# Patient Record
Sex: Female | Born: 1945 | Race: White | Hispanic: No | Marital: Married
Health system: Midwestern US, Academic
[De-identification: ages and names within clinical notes are randomized; demographics above are authoritative.]

---

## 2016-05-23 MED ORDER — FLUCONAZOLE 150 MG PO TAB
ORAL_TABLET | ORAL | 0 refills | 3.00000 days | Status: DC
Start: 2016-05-23 — End: 2016-06-09

## 2016-05-23 MED ORDER — AMOXICILLIN-POT CLAVULANATE 875-125 MG PO TAB
1 | ORAL_TABLET | Freq: Two times a day (BID) | ORAL | 0 refills | 7.00000 days | Status: DC
Start: 2016-05-23 — End: 2016-06-09

## 2016-06-09 MED ORDER — METHOCARBAMOL 500 MG PO TAB
500-1000 mg | ORAL_TABLET | Freq: Four times a day (QID) | ORAL | 5 refills | Status: DC | PRN
Start: 2016-06-09 — End: 2017-01-05

## 2016-06-09 MED ORDER — TRAMADOL 50 MG PO TAB
50-100 mg | ORAL_TABLET | ORAL | 3 refills | Status: DC | PRN
Start: 2016-06-09 — End: 2017-02-02

## 2016-07-26 ENCOUNTER — Encounter: Admit: 2016-07-26 | Discharge: 2016-07-26 | Payer: BC Managed Care – PPO

## 2016-08-23 ENCOUNTER — Ambulatory Visit: Admit: 2016-08-23 | Discharge: 2016-08-24 | Payer: MEDICARE

## 2016-08-23 ENCOUNTER — Encounter: Admit: 2016-08-23 | Discharge: 2016-08-23 | Payer: BC Managed Care – PPO

## 2016-08-23 DIAGNOSIS — R42 Dizziness and giddiness: ICD-10-CM

## 2016-08-23 DIAGNOSIS — I10 Essential (primary) hypertension: Principal | ICD-10-CM

## 2016-08-23 DIAGNOSIS — E039 Hypothyroidism, unspecified: Principal | ICD-10-CM

## 2016-08-23 DIAGNOSIS — K219 Gastro-esophageal reflux disease without esophagitis: ICD-10-CM

## 2016-08-23 DIAGNOSIS — J302 Other seasonal allergic rhinitis: ICD-10-CM

## 2016-08-23 DIAGNOSIS — G43909 Migraine, unspecified, not intractable, without status migrainosus: ICD-10-CM

## 2016-08-23 DIAGNOSIS — M1990 Unspecified osteoarthritis, unspecified site: ICD-10-CM

## 2016-08-23 DIAGNOSIS — K289 Gastrojejunal ulcer, unspecified as acute or chronic, without hemorrhage or perforation: ICD-10-CM

## 2016-08-23 DIAGNOSIS — E785 Hyperlipidemia, unspecified: ICD-10-CM

## 2016-08-23 DIAGNOSIS — N390 Urinary tract infection, site not specified: ICD-10-CM

## 2016-08-23 DIAGNOSIS — F329 Major depressive disorder, single episode, unspecified: ICD-10-CM

## 2016-08-23 DIAGNOSIS — M542 Cervicalgia: ICD-10-CM

## 2016-08-23 NOTE — Progress Notes
Date of Service: 08/23/2016    Tonya Williamson is a 70 y.o. female. DOB: [birth year]   MRN#: [id number]    Subjective:       71 yo female would like to have her bp checked- was 147/84 at her chiro's yesterday-  Checked it at home- 141/80  Having daily headaches for the past 3-4 weeks             Review of Systems   Constitution:        Lost 3 lbs since last visit   HENT:        Had an episode of room spinning about a week ago   Musculoskeletal: Positive for neck pain.        Going to pain clinic for her neck pain- Dr. Ramiro Harvest  Has tried acupuncture     Neurological:        Having some headaches - hurts in the back of her head- sometimes her whole head feels like it is throbbing  Had a neg MRI head 11-16         Objective:     ??? amitriptyline (ELAVIL) 25 mg tablet Take 1 tablet by mouth at bedtime daily.   ??? amLODIPine (NORVASC) 10 mg tablet Take 1 tablet by mouth daily.   ??? atorvastatin (LIPITOR) 40 mg tablet Take 1 tablet by mouth daily.   ??? CALCIUM CARBONATE/VITAMIN D3 (CALCIUM + D PO) Take 1 tablet by mouth daily.   ??? celecoxib (CELEBREX) 200 mg capsule Take 1 capsule by mouth daily.   ??? dicyclomine (BENTYL) 10 mg capsule TAKE ONE CAPSULE BY MOUTH TWICE DAILY   ??? DOCOSAHEXANOIC ACID/EPA (FISH OIL PO) Take 3 Caps by mouth twice daily.   ??? esomeprazole DR(+) (NEXIUM) 40 mg capsule TAKE 1 CAPSULE TWICE DAILY   ??? estradiol (ESTRACE) 0.5 mg tablet Take 1 tablet by mouth daily.   ??? levothyroxine (SYNTHROID) 125 mcg tablet Take 1 tablet by mouth daily.   ??? lisinopril (PRINIVIL, ZESTRIL) 40 mg tablet Take 1 tablet by mouth daily.   ??? methocarbamol (ROBAXIN) 500 mg tablet Take 1-2 tablets by mouth four times daily as needed for Spasms.   ??? mirabegron(+) ER [facility]) 25 mg tablet Take 1 tablet by mouth daily.   ??? MULTIVITAMIN PO Take  by mouth.   ??? traMADol (ULTRAM) 50 mg tablet Take 1-2 tablets by mouth every 6 hours as needed for Pain. Fax [phone]     Vitals:    08/23/16 1521   BP: 130/75   Pulse: 91 SpO2: 97%   Weight: 71.2 kg (157 lb)   Height: 165.1 cm (65)     Body mass index is 26.13 kg/m???.     Physical Exam   Constitutional: She is oriented to person, place, and time. She appears well-developed and well-nourished.   bp was 120/70 by me   HENT:   Head: Normocephalic and atraumatic.   Mouth/Throat: Oropharynx is clear and moist.   TMs clear   Eyes: Conjunctivae are normal. Pupils are equal, round, and reactive to light.   Neck: No thyromegaly present.   Limited ROM of neck   Cardiovascular: Normal rate and regular rhythm.    No murmur heard.  Pulmonary/Chest: Effort normal and breath sounds normal.   Abdominal: Soft. Bowel sounds are normal. She exhibits no distension and no mass. There is no tenderness.   Musculoskeletal: She exhibits no edema.   Lymphadenopathy:     She has no cervical adenopathy.  Neurological: She is alert and oriented to person, place, and time.   Skin: Skin is warm and dry.   Psychiatric: She has a normal mood and affect. Her behavior is normal. Judgment and thought content normal.   Nursing note and vitals reviewed.      A/p  htn- good control by clinic readings today  On lisinopril and norvasc  Hypertension Management:  Medication adherent: yes  Treatment goal: 130/80  Outside blood pressures being performed: yes  BP Readings from Last 3 Encounters:   08/23/16 130/75   06/09/16 100/70   11/23/15 120/82     She did not report significant light-headedness.  Imp: Hypertension controlled  Are barriers to achieving goals present? no  Patient able to self-manage and ready to comply? yes  Educational resources identified? Not at this time  Neck pain and headaches- pt will f/u with pain clinic

## 2016-10-25 ENCOUNTER — Encounter: Admit: 2016-10-25 | Discharge: 2016-10-25 | Payer: BC Managed Care – PPO

## 2016-10-25 MED ORDER — ESOMEPRAZOLE MAGNESIUM 40 MG PO CPDR
ORAL_CAPSULE | Freq: Two times a day (BID) | 3 refills | 30.00000 days | Status: AC
Start: 2016-10-25 — End: 2017-02-02

## 2016-10-25 NOTE — Telephone Encounter
Last refilled: 04/17/16 #90    Last OV: 08/23/16 HTN

## 2016-11-06 ENCOUNTER — Encounter: Admit: 2016-11-06 | Discharge: 2016-11-06 | Payer: BC Managed Care – PPO

## 2016-11-06 DIAGNOSIS — J329 Chronic sinusitis, unspecified: Principal | ICD-10-CM

## 2016-11-06 MED ORDER — AMOXICILLIN-POT CLAVULANATE 875-125 MG PO TAB
1 | ORAL_TABLET | Freq: Two times a day (BID) | ORAL | 0 refills | 7.00000 days | Status: AC
Start: 2016-11-06 — End: ?

## 2016-11-06 MED ORDER — FLUCONAZOLE 150 MG PO TAB
ORAL_TABLET | ORAL | 0 refills | 3.00000 days | Status: AC
Start: 2016-11-06 — End: 2017-01-29

## 2016-11-06 NOTE — Telephone Encounter
Patient notified - wants to thank you!

## 2016-11-06 NOTE — Telephone Encounter
States she would like to know if Dr. Reita Cliche could send a rx to her [facility] in [place] - believes she has a sinus infection - cough productive with green secretions - will also need a med for a yeast infection with an antibiotic.

## 2016-11-06 NOTE — Telephone Encounter
Ok on augmentin 875 mg #20 and diflucan 150 mg #2- rxs sent

## 2016-11-13 ENCOUNTER — Encounter: Admit: 2016-11-13 | Discharge: 2016-11-13 | Payer: BC Managed Care – PPO

## 2016-11-13 MED ORDER — AMITRIPTYLINE 25 MG PO TAB
ORAL_TABLET | Freq: Every evening | 3 refills
Start: 2016-11-13 — End: ?

## 2016-12-11 ENCOUNTER — Encounter: Admit: 2016-12-11 | Discharge: 2016-12-11 | Payer: BC Managed Care – PPO

## 2016-12-11 MED ORDER — CELECOXIB 200 MG PO CAP
ORAL_CAPSULE | Freq: Every day | 2 refills | Status: AC
Start: 2016-12-11 — End: 2017-02-02

## 2016-12-11 NOTE — Telephone Encounter
Last refilled: 01/12/16 #90 X 3    Last OV: 08/23/16 HTN

## 2017-01-05 ENCOUNTER — Encounter: Admit: 2017-01-05 | Discharge: 2017-01-05 | Payer: MEDICARE

## 2017-01-05 MED ORDER — METHOCARBAMOL 500 MG PO TAB
ORAL_TABLET | Freq: Four times a day (QID) | 5 refills | Status: AC | PRN
Start: 2017-01-05 — End: 2017-02-02

## 2017-01-10 ENCOUNTER — Encounter: Admit: 2017-01-10 | Discharge: 2017-01-10 | Payer: MEDICARE

## 2017-01-10 MED ORDER — DICYCLOMINE 10 MG PO CAP
ORAL_CAPSULE | Freq: Two times a day (BID) | 1 refills | Status: AC
Start: 2017-01-10 — End: 2017-02-02

## 2017-01-10 MED ORDER — LEVOTHYROXINE 125 MCG PO TAB
125 ug | ORAL_TABLET | Freq: Every day | ORAL | 1 refills | 30.00000 days | Status: AC
Start: 2017-01-10 — End: 2017-02-02

## 2017-01-10 MED ORDER — LISINOPRIL 40 MG PO TAB
40 mg | ORAL_TABLET | Freq: Every day | ORAL | 1 refills | Status: AC
Start: 2017-01-10 — End: 2017-02-02

## 2017-01-10 MED ORDER — ATORVASTATIN 40 MG PO TAB
40 mg | ORAL_TABLET | Freq: Every day | ORAL | 1 refills | Status: AC
Start: 2017-01-10 — End: 2017-02-02

## 2017-01-10 MED ORDER — ESTRADIOL 0.5 MG PO TAB
0.5 mg | ORAL_TABLET | Freq: Every day | ORAL | 1 refills | 30.00000 days | Status: AC
Start: 2017-01-10 — End: 2017-02-02

## 2017-01-10 NOTE — Telephone Encounter
Last refilled:    Lipitor- 11/23/15 #90 X 3   Bentyl- 04/17/16 #180 X 2   Estrace- 11/23/15 #90 X 3   Synthroid- 11/23/15 #9 0X 3   Lisinopril- 11/23/15    Last OV: 08/23/16 HTN

## 2017-01-29 ENCOUNTER — Encounter: Admit: 2017-01-29 | Discharge: 2017-01-29 | Payer: BC Managed Care – PPO

## 2017-01-29 DIAGNOSIS — B379 Candidiasis, unspecified: ICD-10-CM

## 2017-01-29 DIAGNOSIS — J329 Chronic sinusitis, unspecified: Principal | ICD-10-CM

## 2017-01-29 MED ORDER — AMOXICILLIN-POT CLAVULANATE 875-125 MG PO TAB
1 | ORAL_TABLET | Freq: Two times a day (BID) | ORAL | 0 refills | 7.00000 days | Status: AC
Start: 2017-01-29 — End: ?

## 2017-01-29 MED ORDER — FLUCONAZOLE 150 MG PO TAB
ORAL_TABLET | ORAL | 0 refills | 3.00000 days | Status: AC
Start: 2017-01-29 — End: 2017-04-03

## 2017-01-29 NOTE — Telephone Encounter
States she had a cold last week - has now turned into a sinus infection - cough - drainage down the back of her throat - secretions are green - also a bad sinus headache - would like to know if Dr. Reita ClicheWeinhold can send a rx for an antibiotic and also need diflucan for vaginal yeast from antibiotic.

## 2017-01-29 NOTE — Telephone Encounter
Ok on augmentin 875 mg po bid #20 and diflucan 150 mg #2

## 2017-01-29 NOTE — Telephone Encounter
Rx's sent - patient notified.

## 2017-02-02 ENCOUNTER — Ambulatory Visit: Admit: 2017-02-02 | Discharge: 2017-02-03 | Payer: MEDICARE

## 2017-02-02 ENCOUNTER — Encounter: Admit: 2017-02-02 | Discharge: 2017-02-02 | Payer: BC Managed Care – PPO

## 2017-02-02 DIAGNOSIS — E785 Hyperlipidemia, unspecified: ICD-10-CM

## 2017-02-02 DIAGNOSIS — J302 Other seasonal allergic rhinitis: ICD-10-CM

## 2017-02-02 DIAGNOSIS — M25512 Pain in left shoulder: ICD-10-CM

## 2017-02-02 DIAGNOSIS — R42 Dizziness and giddiness: ICD-10-CM

## 2017-02-02 DIAGNOSIS — I10 Essential (primary) hypertension: ICD-10-CM

## 2017-02-02 DIAGNOSIS — F329 Major depressive disorder, single episode, unspecified: ICD-10-CM

## 2017-02-02 DIAGNOSIS — K219 Gastro-esophageal reflux disease without esophagitis: ICD-10-CM

## 2017-02-02 DIAGNOSIS — N390 Urinary tract infection, site not specified: ICD-10-CM

## 2017-02-02 DIAGNOSIS — M8580 Other specified disorders of bone density and structure, unspecified site: ICD-10-CM

## 2017-02-02 DIAGNOSIS — G43909 Migraine, unspecified, not intractable, without status migrainosus: ICD-10-CM

## 2017-02-02 DIAGNOSIS — M1990 Unspecified osteoarthritis, unspecified site: ICD-10-CM

## 2017-02-02 DIAGNOSIS — N3941 Urge incontinence: Principal | ICD-10-CM

## 2017-02-02 DIAGNOSIS — Z780 Asymptomatic menopausal state: ICD-10-CM

## 2017-02-02 DIAGNOSIS — E039 Hypothyroidism, unspecified: ICD-10-CM

## 2017-02-02 DIAGNOSIS — Z1159 Encounter for screening for other viral diseases: ICD-10-CM

## 2017-02-02 DIAGNOSIS — K289 Gastrojejunal ulcer, unspecified as acute or chronic, without hemorrhage or perforation: ICD-10-CM

## 2017-02-02 MED ORDER — AMITRIPTYLINE 25 MG PO TAB
25 mg | ORAL_TABLET | Freq: Every evening | ORAL | 3 refills | Status: AC
Start: 2017-02-02 — End: 2017-12-17

## 2017-02-02 MED ORDER — MIRABEGRON 25 MG PO TB24
25 mg | ORAL_TABLET | Freq: Every day | ORAL | 3 refills | Status: AC
Start: 2017-02-02 — End: 2017-12-17

## 2017-02-02 MED ORDER — ATORVASTATIN 40 MG PO TAB
40 mg | ORAL_TABLET | Freq: Every day | ORAL | 3 refills | Status: AC
Start: 2017-02-02 — End: 2017-12-17

## 2017-02-02 MED ORDER — LISINOPRIL 40 MG PO TAB
40 mg | ORAL_TABLET | Freq: Every day | ORAL | 3 refills | Status: AC
Start: 2017-02-02 — End: 2017-12-17

## 2017-02-02 MED ORDER — CELECOXIB 200 MG PO CAP
200 mg | ORAL_CAPSULE | Freq: Every day | ORAL | 3 refills | Status: AC
Start: 2017-02-02 — End: 2017-12-17

## 2017-02-02 MED ORDER — METHOCARBAMOL 500 MG PO TAB
500-1000 mg | ORAL_TABLET | Freq: Four times a day (QID) | ORAL | 11 refills | Status: AC | PRN
Start: 2017-02-02 — End: 2017-12-17

## 2017-02-02 MED ORDER — AMLODIPINE 10 MG PO TAB
10 mg | ORAL_TABLET | Freq: Every day | ORAL | 3 refills | Status: AC
Start: 2017-02-02 — End: 2017-12-17

## 2017-02-02 MED ORDER — ESTRADIOL 0.5 MG PO TAB
0.5 mg | ORAL_TABLET | Freq: Every day | ORAL | 3 refills | 43.00000 days | Status: AC
Start: 2017-02-02 — End: 2017-12-17

## 2017-02-02 MED ORDER — DICYCLOMINE 10 MG PO CAP
10 mg | ORAL_CAPSULE | Freq: Two times a day (BID) | ORAL | 3 refills | Status: AC
Start: 2017-02-02 — End: 2017-12-17

## 2017-02-02 MED ORDER — LEVOTHYROXINE 125 MCG PO TAB
125 ug | ORAL_TABLET | Freq: Every day | ORAL | 3 refills | 30.00000 days | Status: AC
Start: 2017-02-02 — End: 2017-05-07

## 2017-02-02 MED ORDER — ESOMEPRAZOLE MAGNESIUM 40 MG PO CPDR
40 mg | ORAL_CAPSULE | Freq: Two times a day (BID) | ORAL | 3 refills | 30.00000 days | Status: AC
Start: 2017-02-02 — End: 2017-12-17

## 2017-02-02 NOTE — Progress Notes
Date of Service: 02/02/2017    Tonya Williamson is a 71 y.o. female. DOB: [birth year]   MRN#: [id number]    Subjective:       71 yo female here for f/u of medical issues hx of chronic neck pain- going to have her left shoulder replaced in April with Dr. Lisa Roca- hyperlipidemia-GERD-hypothyroidism  Reviewed hx in epic briefly  Needs med rfs             Review of Systems   Constitution: Negative for chills, diaphoresis and fever.        Wt stable   HENT: Negative for hearing loss.         On augmentin for a sinus infection- is feeling better  Goes to the dentist every 6 months   Eyes:        No recent vision changes- going to the eye doc in January   Cardiovascular: Negative for chest pain, leg swelling and palpitations.   Respiratory: Negative for cough and shortness of breath.    Hematologic/Lymphatic: Does not bruise/bleed easily.   Skin:        No current skin problems   Musculoskeletal:        Has neck and lt shoulder pain   Gastrointestinal: Positive for heartburn. Negative for abdominal pain, constipation, diarrhea, nausea and vomiting.        Her GERD sxs are under control   Genitourinary: Negative for dysuria and hematuria.   Neurological:        Her headaches are better   Psychiatric/Behavioral:        Sleeping ok  Stress level is good   Allergic/Immunologic: Negative.          Objective:     ??? amitriptyline (ELAVIL) 25 mg tablet Take 1 tablet by mouth at bedtime daily.   ??? amLODIPine (NORVASC) 10 mg tablet Take 1 tablet by mouth daily.   ??? amoxicillin/K clavulanate (AUGMENTIN) 875/125 mg tablet Take one tablet by mouth twice daily for 10 days. Take with food.   ??? atorvastatin (LIPITOR) 40 mg tablet TAKE 1 TABLET BY MOUTH DAILY.   ??? CALCIUM CARBONATE/VITAMIN D3 (CALCIUM + D PO) Take 1 tablet by mouth daily.   ??? celecoxib (CELEBREX) 200 mg capsule TAKE 1 CAPSULE DAILY   ??? dicyclomine (BENTYL) 10 mg capsule TAKE 1 CAPSULE BY MOUTH TWICE DAILY. ??? DOCOSAHEXANOIC ACID/EPA (FISH OIL PO) Take 3 Caps by mouth twice daily.   ??? duloxetine DR (CYMBALTA) 60 mg capsule Take 60 mg by mouth daily.   ??? esomeprazole DR(+) (NEXIUM) 40 mg capsule TAKE 1 CAPSULE TWICE DAILY   ??? estradiol (ESTRACE) 0.5 mg tablet TAKE 1 TABLET BY MOUTH DAILY.   ??? fluconazole (DIFLUCAN) 150 mg tablet 1 tab now and repeat in 48 hours   ??? gabapentin (NEURONTIN) 100 mg capsule Take 100 mg by mouth three times daily.   ??? levothyroxine (SYNTHROID) 125 mcg tablet TAKE 1 TABLET BY MOUTH DAILY.   ??? lisinopril (PRINIVIL, ZESTRIL) 40 mg tablet TAKE 1 TABLET BY MOUTH DAILY.   ??? methocarbamol (ROBAXIN) 500 mg tablet TAKE 1 TO 2 TABLETS BY MOUTH FOUR TIMES DAILY AS NEEDED FOR SPASMS.   ??? mirabegron(+) ER [facility]) 25 mg tablet Take 1 tablet by mouth daily.   ??? MULTIVITAMIN PO Take  by mouth.   ??? traMADol (ULTRAM) 50 mg tablet Take 1-2 tablets by mouth every 6 hours as needed for Pain. Fax [phone]     Vitals:    02/02/17  0848   BP: 135/85   Pulse: 71   SpO2: 98%   Weight: 73 kg (161 lb)   Height: 165.1 cm (65)     Body mass index is 26.79 kg/m???.     Physical Exam   Constitutional: She is oriented to person, place, and time. She appears well-developed and well-nourished.   HENT:   Head: Normocephalic and atraumatic.   Mouth/Throat: Oropharynx is clear and moist.   TMs clear   Eyes: Conjunctivae are normal. Pupils are equal, round, and reactive to light.   Neck: Neck supple. No thyromegaly present.   Cardiovascular: Normal rate and regular rhythm.   No murmur heard.  Pulmonary/Chest: Effort normal and breath sounds normal.   Abdominal: Soft. Bowel sounds are normal. She exhibits no distension and no mass. There is no tenderness.   Genitourinary:   Genitourinary Comments: Breast exam- no dominant masses, dimpling, d/c, or adenopathy   Musculoskeletal: She exhibits no edema.   Lymphadenopathy:     She has no cervical adenopathy.   Neurological: She is alert and oriented to person, place, and time. Skin: Skin is warm and dry.   Psychiatric: She has a normal mood and affect. Her behavior is normal. Judgment and thought content normal.   Nursing note and vitals reviewed.      A/p  htn- bp was 130/65 on recheck- under good control  Hypertension Management:  Medication adherent: yes    Treatment goal: 130/80 or less  Outside blood pressures being performed: yes  BP Readings from Last 3 Encounters:   02/02/17 130/65   08/23/16 130/75   06/09/16 100/70     She did not report significant light-headedness.  Imp: Hypertension controlled  Are barriers to achieving goals present? no  Patient able to self-manage and ready to comply? yes  Educational resources identified? Not at this time  Urge incontinence- cont myrbetriq  Postmenopausal- osteopenia- check dexa  Hyperlipidemia- check lab this spring  Hypothyroidism- check lab this spring  Hept c screening test  Lt shoulder pain- going to to have it replaced this spring  Will come back for a preop visit   Up to date on her mammogram- colonoscopy- immunizations except for her 2nd shingles shot

## 2017-03-06 ENCOUNTER — Encounter: Admit: 2017-03-06 | Discharge: 2017-03-06 | Payer: MEDICARE

## 2017-03-06 ENCOUNTER — Encounter: Admit: 2017-03-06 | Discharge: 2017-03-06 | Payer: BC Managed Care – PPO

## 2017-03-06 DIAGNOSIS — B379 Candidiasis, unspecified: Principal | ICD-10-CM

## 2017-03-06 MED ORDER — AMOXICILLIN-POT CLAVULANATE 875-125 MG PO TAB
1 | ORAL_TABLET | Freq: Two times a day (BID) | ORAL | 0 refills | 7.00000 days | Status: AC
Start: 2017-03-06 — End: 2017-04-03

## 2017-03-06 MED ORDER — FLUCONAZOLE 150 MG PO TAB
ORAL_TABLET | ORAL | 0 refills | 3.00000 days | Status: AC
Start: 2017-03-06 — End: 2017-04-03

## 2017-03-13 ENCOUNTER — Ambulatory Visit: Admit: 2017-03-13 | Discharge: 2017-03-14 | Payer: MEDICARE

## 2017-03-13 ENCOUNTER — Encounter: Admit: 2017-03-13 | Discharge: 2017-03-13 | Payer: BC Managed Care – PPO

## 2017-03-13 DIAGNOSIS — K289 Gastrojejunal ulcer, unspecified as acute or chronic, without hemorrhage or perforation: ICD-10-CM

## 2017-03-13 DIAGNOSIS — F329 Major depressive disorder, single episode, unspecified: ICD-10-CM

## 2017-03-13 DIAGNOSIS — G43909 Migraine, unspecified, not intractable, without status migrainosus: ICD-10-CM

## 2017-03-13 DIAGNOSIS — E039 Hypothyroidism, unspecified: Principal | ICD-10-CM

## 2017-03-13 DIAGNOSIS — M1990 Unspecified osteoarthritis, unspecified site: ICD-10-CM

## 2017-03-13 DIAGNOSIS — R42 Dizziness and giddiness: ICD-10-CM

## 2017-03-13 DIAGNOSIS — N390 Urinary tract infection, site not specified: ICD-10-CM

## 2017-03-13 DIAGNOSIS — J302 Other seasonal allergic rhinitis: ICD-10-CM

## 2017-03-13 DIAGNOSIS — K219 Gastro-esophageal reflux disease without esophagitis: ICD-10-CM

## 2017-03-13 DIAGNOSIS — E785 Hyperlipidemia, unspecified: ICD-10-CM

## 2017-03-13 DIAGNOSIS — R197 Diarrhea, unspecified: Principal | ICD-10-CM

## 2017-04-03 ENCOUNTER — Encounter: Admit: 2017-04-03 | Discharge: 2017-04-03 | Payer: BC Managed Care – PPO

## 2017-04-03 DIAGNOSIS — B379 Candidiasis, unspecified: Principal | ICD-10-CM

## 2017-04-03 MED ORDER — DOXYCYCLINE HYCLATE 100 MG PO TAB
100 mg | ORAL_TABLET | Freq: Two times a day (BID) | ORAL | 0 refills | 8.00000 days | Status: AC
Start: 2017-04-03 — End: ?

## 2017-04-03 MED ORDER — FLUCONAZOLE 150 MG PO TAB
ORAL_TABLET | ORAL | 0 refills | 3.00000 days | Status: AC
Start: 2017-04-03 — End: 2017-04-27

## 2017-04-27 ENCOUNTER — Encounter: Admit: 2017-04-27 | Discharge: 2017-04-27 | Payer: BC Managed Care – PPO

## 2017-04-27 DIAGNOSIS — B379 Candidiasis, unspecified: Principal | ICD-10-CM

## 2017-04-27 MED ORDER — FLUCONAZOLE 150 MG PO TAB
ORAL_TABLET | ORAL | 0 refills | 3.00000 days | Status: AC
Start: 2017-04-27 — End: 2017-05-21

## 2017-04-27 MED ORDER — AMOXICILLIN-POT CLAVULANATE 875-125 MG PO TAB
1 | ORAL_TABLET | Freq: Two times a day (BID) | ORAL | 0 refills | 7.00000 days | Status: AC
Start: 2017-04-27 — End: 2017-05-21

## 2017-05-04 ENCOUNTER — Encounter: Admit: 2017-05-04 | Discharge: 2017-05-04 | Payer: BC Managed Care – PPO

## 2017-05-04 ENCOUNTER — Ambulatory Visit: Admit: 2017-05-04 | Discharge: 2017-05-05 | Payer: MEDICARE

## 2017-05-04 ENCOUNTER — Ambulatory Visit: Admit: 2017-05-04 | Discharge: 2017-05-04 | Payer: MEDICARE

## 2017-05-04 ENCOUNTER — Ambulatory Visit: Admit: 2017-05-04 | Discharge: 2017-05-05 | Payer: BC Managed Care – PPO

## 2017-05-04 DIAGNOSIS — R7309 Other abnormal glucose: ICD-10-CM

## 2017-05-04 DIAGNOSIS — J302 Other seasonal allergic rhinitis: ICD-10-CM

## 2017-05-04 DIAGNOSIS — E785 Hyperlipidemia, unspecified: ICD-10-CM

## 2017-05-04 DIAGNOSIS — Z01818 Encounter for other preprocedural examination: ICD-10-CM

## 2017-05-04 DIAGNOSIS — R42 Dizziness and giddiness: ICD-10-CM

## 2017-05-04 DIAGNOSIS — K219 Gastro-esophageal reflux disease without esophagitis: ICD-10-CM

## 2017-05-04 DIAGNOSIS — E039 Hypothyroidism, unspecified: ICD-10-CM

## 2017-05-04 DIAGNOSIS — R32 Unspecified urinary incontinence: ICD-10-CM

## 2017-05-04 DIAGNOSIS — T148XXA Other injury of unspecified body region, initial encounter: ICD-10-CM

## 2017-05-04 DIAGNOSIS — I10 Essential (primary) hypertension: ICD-10-CM

## 2017-05-04 DIAGNOSIS — Z1159 Encounter for screening for other viral diseases: ICD-10-CM

## 2017-05-04 DIAGNOSIS — G43909 Migraine, unspecified, not intractable, without status migrainosus: ICD-10-CM

## 2017-05-04 DIAGNOSIS — N390 Urinary tract infection, site not specified: ICD-10-CM

## 2017-05-04 DIAGNOSIS — M25512 Pain in left shoulder: Principal | ICD-10-CM

## 2017-05-04 DIAGNOSIS — M1990 Unspecified osteoarthritis, unspecified site: ICD-10-CM

## 2017-05-04 DIAGNOSIS — F329 Major depressive disorder, single episode, unspecified: ICD-10-CM

## 2017-05-04 DIAGNOSIS — K289 Gastrojejunal ulcer, unspecified as acute or chronic, without hemorrhage or perforation: ICD-10-CM

## 2017-05-05 LAB — HEMOGLOBIN A1C: Lab: 6.2 % — ABNORMAL HIGH (ref 4.0–6.0)

## 2017-05-05 LAB — CBC AND DIFF
Lab: 0 % (ref 0–2)
Lab: 0 10*3/uL (ref 0–0.20)
Lab: 0.2 10*3/uL (ref 0–0.45)
Lab: 0.5 10*3/uL (ref 0–0.80)
Lab: 14 % (ref 11–15)
Lab: 2 % (ref 0–5)
Lab: 2.3 10*3/uL (ref 1.0–4.8)
Lab: 28 % (ref 24–44)
Lab: 30 pg (ref 26–34)
Lab: 33 g/dL (ref 32.0–36.0)
Lab: 373 10*3/uL (ref 150–400)
Lab: 38 % (ref 36–45)
Lab: 4.1 M/UL (ref 4.0–5.0)
Lab: 5.2 10*3/uL (ref 1.8–7.0)
Lab: 6 % (ref 4–12)
Lab: 64 % (ref >60)
Lab: 7.9 FL (ref >60)
Lab: 8.2 10*3/uL (ref 4.5–11.0)
Lab: 93 FL — ABNORMAL HIGH (ref 80–100)

## 2017-05-05 LAB — URINALYSIS DIPSTICK
Lab: NEGATIVE
Lab: NEGATIVE
Lab: NEGATIVE
Lab: NEGATIVE
Lab: NEGATIVE

## 2017-05-05 LAB — HEPATITIS C ANTIBODY W REFLEX HCV PCR QUANT: Lab: NEGATIVE % — ABNORMAL LOW (ref 28–42)

## 2017-05-05 LAB — COMPREHENSIVE METABOLIC PANEL
Lab: 0.7 mg/dL (ref 0.4–1.00)
Lab: 135 MMOL/L — ABNORMAL LOW (ref >40)
Lab: 3.5 MMOL/L (ref <100)
Lab: 88 mg/dL (ref 70–100)

## 2017-05-05 LAB — FREE T4-FREE THYROXINE: Lab: 1.2 ng/dL (ref 0.6–1.6)

## 2017-05-05 LAB — URINALYSIS, MICROSCOPIC

## 2017-05-05 LAB — LIPID PROFILE
Lab: 162 mg/dL (ref <200)
Lab: 226 mg/dL — ABNORMAL HIGH (ref <150)

## 2017-05-05 LAB — PTT (APTT): Lab: 28 s (ref 24.0–36.5)

## 2017-05-05 LAB — SED RATE: Lab: 15 mm/h (ref 0–30)

## 2017-05-05 LAB — PROTIME INR (PT): Lab: 1 MMOL/L (ref >60)

## 2017-05-05 LAB — TSH WITH FREE T4 REFLEX: Lab: 8.6 uU/mL — ABNORMAL HIGH (ref 0.35–5.00)

## 2017-05-06 LAB — CULTURE-URINE W/SENSITIVITY

## 2017-05-07 ENCOUNTER — Encounter: Admit: 2017-05-07 | Discharge: 2017-05-07 | Payer: BC Managed Care – PPO

## 2017-05-07 DIAGNOSIS — E039 Hypothyroidism, unspecified: Principal | ICD-10-CM

## 2017-05-07 MED ORDER — LEVOTHYROXINE 150 MCG PO TAB
150 ug | ORAL_TABLET | Freq: Every day | ORAL | 3 refills | 30.00000 days | Status: AC
Start: 2017-05-07 — End: 2018-04-06

## 2017-05-17 ENCOUNTER — Encounter: Admit: 2017-05-17 | Discharge: 2017-05-17 | Payer: MEDICARE

## 2017-05-17 DIAGNOSIS — F329 Major depressive disorder, single episode, unspecified: ICD-10-CM

## 2017-05-17 DIAGNOSIS — J302 Other seasonal allergic rhinitis: ICD-10-CM

## 2017-05-17 DIAGNOSIS — E785 Hyperlipidemia, unspecified: ICD-10-CM

## 2017-05-17 DIAGNOSIS — M1990 Unspecified osteoarthritis, unspecified site: ICD-10-CM

## 2017-05-17 DIAGNOSIS — N390 Urinary tract infection, site not specified: ICD-10-CM

## 2017-05-17 DIAGNOSIS — R42 Dizziness and giddiness: ICD-10-CM

## 2017-05-17 DIAGNOSIS — G43909 Migraine, unspecified, not intractable, without status migrainosus: ICD-10-CM

## 2017-05-17 DIAGNOSIS — K219 Gastro-esophageal reflux disease without esophagitis: ICD-10-CM

## 2017-05-17 DIAGNOSIS — K289 Gastrojejunal ulcer, unspecified as acute or chronic, without hemorrhage or perforation: ICD-10-CM

## 2017-05-17 DIAGNOSIS — E039 Hypothyroidism, unspecified: Principal | ICD-10-CM

## 2017-05-21 ENCOUNTER — Encounter: Admit: 2017-05-21 | Discharge: 2017-05-21 | Payer: MEDICARE

## 2017-05-21 ENCOUNTER — Ambulatory Visit: Admit: 2017-05-21 | Discharge: 2017-05-22 | Payer: MEDICARE

## 2017-05-21 DIAGNOSIS — G43909 Migraine, unspecified, not intractable, without status migrainosus: ICD-10-CM

## 2017-05-21 DIAGNOSIS — M1990 Unspecified osteoarthritis, unspecified site: ICD-10-CM

## 2017-05-21 DIAGNOSIS — K289 Gastrojejunal ulcer, unspecified as acute or chronic, without hemorrhage or perforation: ICD-10-CM

## 2017-05-21 DIAGNOSIS — N390 Urinary tract infection, site not specified: ICD-10-CM

## 2017-05-21 DIAGNOSIS — F329 Major depressive disorder, single episode, unspecified: ICD-10-CM

## 2017-05-21 DIAGNOSIS — E039 Hypothyroidism, unspecified: Principal | ICD-10-CM

## 2017-05-21 DIAGNOSIS — K219 Gastro-esophageal reflux disease without esophagitis: ICD-10-CM

## 2017-05-21 DIAGNOSIS — J302 Other seasonal allergic rhinitis: ICD-10-CM

## 2017-05-21 DIAGNOSIS — B379 Candidiasis, unspecified: Principal | ICD-10-CM

## 2017-05-21 DIAGNOSIS — R42 Dizziness and giddiness: ICD-10-CM

## 2017-05-21 DIAGNOSIS — E785 Hyperlipidemia, unspecified: ICD-10-CM

## 2017-05-21 DIAGNOSIS — J069 Acute upper respiratory infection, unspecified: ICD-10-CM

## 2017-05-21 MED ORDER — BENZONATATE 200 MG PO CAP
200 mg | ORAL_CAPSULE | ORAL | 1 refills | 9.00000 days | Status: AC | PRN
Start: 2017-05-21 — End: 2017-06-15

## 2017-05-21 MED ORDER — DOXYCYCLINE HYCLATE 100 MG PO TAB
100 mg | ORAL_TABLET | Freq: Two times a day (BID) | ORAL | 0 refills | 8.00000 days | Status: AC
Start: 2017-05-21 — End: ?

## 2017-05-21 MED ORDER — FLUCONAZOLE 150 MG PO TAB
ORAL_TABLET | ORAL | 0 refills | 3.00000 days | Status: AC
Start: 2017-05-21 — End: 2017-06-15

## 2017-05-24 ENCOUNTER — Encounter: Admit: 2017-05-24 | Discharge: 2017-05-24 | Payer: BC Managed Care – PPO

## 2017-05-24 ENCOUNTER — Encounter: Admit: 2017-05-24 | Discharge: 2017-05-24 | Payer: MEDICARE

## 2017-05-24 DIAGNOSIS — Z01818 Encounter for other preprocedural examination: Principal | ICD-10-CM

## 2017-06-15 ENCOUNTER — Encounter: Admit: 2017-06-15 | Discharge: 2017-06-15 | Payer: MEDICARE

## 2017-06-15 ENCOUNTER — Ambulatory Visit: Admit: 2017-06-15 | Discharge: 2017-06-15 | Payer: MEDICARE

## 2017-06-15 ENCOUNTER — Ambulatory Visit: Admit: 2017-06-15 | Discharge: 2017-06-16 | Payer: MEDICARE

## 2017-06-15 DIAGNOSIS — R32 Unspecified urinary incontinence: ICD-10-CM

## 2017-06-15 DIAGNOSIS — K289 Gastrojejunal ulcer, unspecified as acute or chronic, without hemorrhage or perforation: ICD-10-CM

## 2017-06-15 DIAGNOSIS — K219 Gastro-esophageal reflux disease without esophagitis: ICD-10-CM

## 2017-06-15 DIAGNOSIS — J302 Other seasonal allergic rhinitis: ICD-10-CM

## 2017-06-15 DIAGNOSIS — I10 Essential (primary) hypertension: ICD-10-CM

## 2017-06-15 DIAGNOSIS — R42 Dizziness and giddiness: ICD-10-CM

## 2017-06-15 DIAGNOSIS — Z01818 Encounter for other preprocedural examination: ICD-10-CM

## 2017-06-15 DIAGNOSIS — E039 Hypothyroidism, unspecified: Principal | ICD-10-CM

## 2017-06-15 DIAGNOSIS — T148XXA Other injury of unspecified body region, initial encounter: ICD-10-CM

## 2017-06-15 DIAGNOSIS — F329 Major depressive disorder, single episode, unspecified: ICD-10-CM

## 2017-06-15 DIAGNOSIS — M1990 Unspecified osteoarthritis, unspecified site: ICD-10-CM

## 2017-06-15 DIAGNOSIS — G43909 Migraine, unspecified, not intractable, without status migrainosus: ICD-10-CM

## 2017-06-15 DIAGNOSIS — E785 Hyperlipidemia, unspecified: ICD-10-CM

## 2017-06-15 DIAGNOSIS — N390 Urinary tract infection, site not specified: ICD-10-CM

## 2017-06-15 LAB — CBC AND DIFF
Lab: 0 10*3/uL (ref 0–0.20)
Lab: 0.3 10*3/uL (ref 0–0.45)
Lab: 0.4 10*3/uL (ref 0–0.80)
Lab: 1 % (ref >60)
Lab: 13 g/dL (ref 12.0–15.0)
Lab: 14 % (ref 11–15)
Lab: 2.1 10*3/uL (ref 1.0–4.8)
Lab: 2.7 10*3/uL (ref 1.8–7.0)
Lab: 31 pg (ref 26–34)
Lab: 32 g/dL (ref 32.0–36.0)
Lab: 336 10*3/uL (ref 150–400)
Lab: 39 % (ref 24–44)
Lab: 39 % (ref 36–45)
Lab: 4.1 M/UL (ref 4.0–5.0)
Lab: 48 % (ref 41–77)
Lab: 5 % (ref >60)
Lab: 5.6 K/UL (ref 4.5–11.0)
Lab: 7 % (ref 4–12)
Lab: 8.1 FL (ref 7–11)
Lab: 95 FL (ref 80–100)

## 2017-06-15 LAB — COMPREHENSIVE METABOLIC PANEL
Lab: 139 MMOL/L (ref 137–147)
Lab: 4.2 MMOL/L (ref 3.5–5.1)

## 2017-06-15 LAB — PTT (APTT): Lab: 28 s (ref 24.0–36.5)

## 2017-06-15 LAB — URINALYSIS, MICROSCOPIC

## 2017-06-15 LAB — THYROID STIMULATING HORMONE-TSH: Lab: 3.1 uU/mL (ref 0.35–5.00)

## 2017-06-15 LAB — URINALYSIS DIPSTICK
Lab: NEGATIVE
Lab: NEGATIVE 10*3/uL (ref 4.5–11.0)
Lab: POSITIVE MMOL/L — AB (ref 137–147)

## 2017-06-15 LAB — PROTIME INR (PT): Lab: 0.9 K/UL (ref 0.8–1.2)

## 2017-06-15 LAB — SED RATE: Lab: 5 mm/h (ref 0–30)

## 2017-06-16 LAB — CULTURE-URINE W/SENSITIVITY

## 2017-06-22 ENCOUNTER — Ambulatory Visit: Admit: 2017-06-22 | Discharge: 2017-06-22 | Payer: MEDICARE

## 2017-06-22 DIAGNOSIS — Z1231 Encounter for screening mammogram for malignant neoplasm of breast: Principal | ICD-10-CM

## 2017-06-23 ENCOUNTER — Encounter: Admit: 2017-06-23 | Discharge: 2017-06-23 | Payer: BC Managed Care – PPO

## 2017-06-23 DIAGNOSIS — J302 Other seasonal allergic rhinitis: ICD-10-CM

## 2017-06-23 DIAGNOSIS — E039 Hypothyroidism, unspecified: Principal | ICD-10-CM

## 2017-06-23 DIAGNOSIS — K289 Gastrojejunal ulcer, unspecified as acute or chronic, without hemorrhage or perforation: ICD-10-CM

## 2017-06-23 DIAGNOSIS — F329 Major depressive disorder, single episode, unspecified: ICD-10-CM

## 2017-06-23 DIAGNOSIS — K219 Gastro-esophageal reflux disease without esophagitis: ICD-10-CM

## 2017-06-23 DIAGNOSIS — E785 Hyperlipidemia, unspecified: ICD-10-CM

## 2017-06-23 DIAGNOSIS — M1990 Unspecified osteoarthritis, unspecified site: ICD-10-CM

## 2017-06-23 DIAGNOSIS — G43909 Migraine, unspecified, not intractable, without status migrainosus: ICD-10-CM

## 2017-06-23 DIAGNOSIS — R42 Dizziness and giddiness: ICD-10-CM

## 2017-06-23 DIAGNOSIS — N390 Urinary tract infection, site not specified: ICD-10-CM

## 2017-10-05 ENCOUNTER — Encounter: Admit: 2017-10-05 | Discharge: 2017-10-05 | Payer: BC Managed Care – PPO

## 2017-10-05 DIAGNOSIS — B373 Candidiasis of vulva and vagina: ICD-10-CM

## 2017-10-05 DIAGNOSIS — J329 Chronic sinusitis, unspecified: Principal | ICD-10-CM

## 2017-10-05 MED ORDER — AMOXICILLIN-POT CLAVULANATE 875-125 MG PO TAB
1 | ORAL_TABLET | Freq: Two times a day (BID) | ORAL | 0 refills | 7.00000 days | Status: AC
Start: 2017-10-05 — End: 2017-10-05

## 2017-10-05 MED ORDER — FLUCONAZOLE 150 MG PO TAB
ORAL_TABLET | ORAL | 0 refills | 3.00000 days | Status: AC
Start: 2017-10-05 — End: 2017-11-13

## 2017-10-05 MED ORDER — AMOXICILLIN-POT CLAVULANATE 875-125 MG PO TAB
1 | ORAL_TABLET | Freq: Two times a day (BID) | ORAL | 0 refills | 7.00000 days | Status: AC
Start: 2017-10-05 — End: ?

## 2017-11-13 ENCOUNTER — Encounter: Admit: 2017-11-13 | Discharge: 2017-11-13 | Payer: MEDICARE

## 2017-11-13 ENCOUNTER — Ambulatory Visit: Admit: 2017-11-13 | Discharge: 2017-11-13 | Payer: MEDICARE

## 2017-11-13 DIAGNOSIS — Z23 Encounter for immunization: ICD-10-CM

## 2017-11-13 DIAGNOSIS — K289 Gastrojejunal ulcer, unspecified as acute or chronic, without hemorrhage or perforation: ICD-10-CM

## 2017-11-13 DIAGNOSIS — M1990 Unspecified osteoarthritis, unspecified site: ICD-10-CM

## 2017-11-13 DIAGNOSIS — R0781 Pleurodynia: Principal | ICD-10-CM

## 2017-11-13 DIAGNOSIS — R42 Dizziness and giddiness: ICD-10-CM

## 2017-11-13 DIAGNOSIS — N390 Urinary tract infection, site not specified: ICD-10-CM

## 2017-11-13 DIAGNOSIS — F329 Major depressive disorder, single episode, unspecified: ICD-10-CM

## 2017-11-13 DIAGNOSIS — E785 Hyperlipidemia, unspecified: ICD-10-CM

## 2017-11-13 DIAGNOSIS — G43909 Migraine, unspecified, not intractable, without status migrainosus: ICD-10-CM

## 2017-11-13 DIAGNOSIS — K219 Gastro-esophageal reflux disease without esophagitis: ICD-10-CM

## 2017-11-13 DIAGNOSIS — E039 Hypothyroidism, unspecified: Principal | ICD-10-CM

## 2017-11-13 DIAGNOSIS — J302 Other seasonal allergic rhinitis: ICD-10-CM

## 2017-12-04 ENCOUNTER — Encounter: Admit: 2017-12-04 | Discharge: 2017-12-04 | Payer: BC Managed Care – PPO

## 2017-12-04 ENCOUNTER — Encounter: Admit: 2017-12-04 | Discharge: 2017-12-04 | Payer: MEDICARE

## 2017-12-04 DIAGNOSIS — J329 Chronic sinusitis, unspecified: Principal | ICD-10-CM

## 2017-12-04 MED ORDER — FLUCONAZOLE 150 MG PO TAB
ORAL_TABLET | ORAL | 0 refills | 3.00000 days | Status: AC
Start: 2017-12-04 — End: 2018-03-12

## 2017-12-04 MED ORDER — AMOXICILLIN-POT CLAVULANATE 875-125 MG PO TAB
1 | ORAL_TABLET | Freq: Two times a day (BID) | ORAL | 0 refills | 7.00000 days | Status: AC
Start: 2017-12-04 — End: ?

## 2017-12-17 ENCOUNTER — Encounter: Admit: 2017-12-17 | Discharge: 2017-12-17 | Payer: BC Managed Care – PPO

## 2017-12-17 ENCOUNTER — Ambulatory Visit: Admit: 2017-12-17 | Discharge: 2017-12-18 | Payer: MEDICARE

## 2017-12-17 DIAGNOSIS — M1990 Unspecified osteoarthritis, unspecified site: ICD-10-CM

## 2017-12-17 DIAGNOSIS — E039 Hypothyroidism, unspecified: Principal | ICD-10-CM

## 2017-12-17 DIAGNOSIS — K219 Gastro-esophageal reflux disease without esophagitis: ICD-10-CM

## 2017-12-17 DIAGNOSIS — R42 Dizziness and giddiness: ICD-10-CM

## 2017-12-17 DIAGNOSIS — E785 Hyperlipidemia, unspecified: ICD-10-CM

## 2017-12-17 DIAGNOSIS — G43909 Migraine, unspecified, not intractable, without status migrainosus: ICD-10-CM

## 2017-12-17 DIAGNOSIS — J302 Other seasonal allergic rhinitis: ICD-10-CM

## 2017-12-17 DIAGNOSIS — K289 Gastrojejunal ulcer, unspecified as acute or chronic, without hemorrhage or perforation: ICD-10-CM

## 2017-12-17 DIAGNOSIS — N390 Urinary tract infection, site not specified: ICD-10-CM

## 2017-12-17 DIAGNOSIS — R05 Cough: Principal | ICD-10-CM

## 2017-12-17 DIAGNOSIS — F329 Major depressive disorder, single episode, unspecified: ICD-10-CM

## 2017-12-17 MED ORDER — AMITRIPTYLINE 25 MG PO TAB
25 mg | ORAL_TABLET | Freq: Every evening | ORAL | 3 refills | Status: AC
Start: 2017-12-17 — End: 2019-01-05

## 2017-12-17 MED ORDER — CELECOXIB 200 MG PO CAP
200 mg | ORAL_CAPSULE | Freq: Every day | ORAL | 3 refills | Status: AC
Start: 2017-12-17 — End: ?

## 2017-12-17 MED ORDER — BENZONATATE 200 MG PO CAP
200 mg | ORAL_CAPSULE | ORAL | 0 refills | 9.00000 days | Status: AC | PRN
Start: 2017-12-17 — End: 2018-03-12

## 2017-12-17 MED ORDER — DULOXETINE 60 MG PO CPDR
60 mg | ORAL_CAPSULE | Freq: Every day | ORAL | 3 refills | 60.00000 days | Status: AC
Start: 2017-12-17 — End: 2019-01-24

## 2017-12-17 MED ORDER — ESOMEPRAZOLE MAGNESIUM 40 MG PO CPDR
40 mg | ORAL_CAPSULE | Freq: Two times a day (BID) | ORAL | 3 refills | Status: CN
Start: 2017-12-17 — End: ?

## 2017-12-17 MED ORDER — MIRABEGRON 25 MG PO TB24
25 mg | ORAL_TABLET | Freq: Every day | ORAL | 3 refills | Status: AC
Start: 2017-12-17 — End: 2019-02-04

## 2017-12-17 MED ORDER — ESOMEPRAZOLE MAGNESIUM 40 MG PO CPDR
40 mg | ORAL_CAPSULE | Freq: Two times a day (BID) | ORAL | 3 refills | 30.00000 days | Status: AC
Start: 2017-12-17 — End: 2019-06-02

## 2017-12-17 MED ORDER — AMLODIPINE 10 MG PO TAB
10 mg | ORAL_TABLET | Freq: Every day | ORAL | 3 refills | Status: AC
Start: 2017-12-17 — End: 2019-01-05

## 2017-12-17 MED ORDER — CELECOXIB 200 MG PO CAP
200 mg | ORAL_CAPSULE | Freq: Every day | ORAL | 3 refills | Status: CN
Start: 2017-12-17 — End: ?

## 2017-12-17 MED ORDER — DICYCLOMINE 10 MG PO CAP
10 mg | ORAL_CAPSULE | Freq: Two times a day (BID) | ORAL | 3 refills | Status: AC
Start: 2017-12-17 — End: 2019-01-05

## 2017-12-17 MED ORDER — ESTRADIOL 0.5 MG PO TAB
0.5 mg | ORAL_TABLET | Freq: Every day | ORAL | 3 refills | 43.00000 days | Status: AC
Start: 2017-12-17 — End: 2019-01-05

## 2017-12-17 MED ORDER — MIRABEGRON 25 MG PO TB24
25 mg | ORAL_TABLET | Freq: Every day | ORAL | 3 refills | Status: CN
Start: 2017-12-17 — End: ?

## 2017-12-17 MED ORDER — ATORVASTATIN 40 MG PO TAB
40 mg | ORAL_TABLET | Freq: Every day | ORAL | 3 refills | Status: AC
Start: 2017-12-17 — End: 2018-01-15

## 2017-12-17 MED ORDER — LISINOPRIL 40 MG PO TAB
40 mg | ORAL_TABLET | Freq: Every day | ORAL | 3 refills | Status: AC
Start: 2017-12-17 — End: 2019-01-05

## 2017-12-17 MED ORDER — AZITHROMYCIN 250 MG PO TAB
ORAL_TABLET | Freq: Every day | 0 refills | Status: AC
Start: 2017-12-17 — End: 2018-03-12

## 2017-12-17 MED ORDER — METHOCARBAMOL 500 MG PO TAB
500-1000 mg | ORAL_TABLET | Freq: Four times a day (QID) | ORAL | 11 refills | Status: AC | PRN
Start: 2017-12-17 — End: 2019-01-21

## 2017-12-18 DIAGNOSIS — N3941 Urge incontinence: Secondary | ICD-10-CM

## 2017-12-18 DIAGNOSIS — R931 Abnormal findings on diagnostic imaging of heart and coronary circulation: ICD-10-CM

## 2018-01-02 ENCOUNTER — Encounter: Admit: 2018-01-02 | Discharge: 2018-01-02 | Payer: BC Managed Care – PPO

## 2018-01-02 DIAGNOSIS — R42 Dizziness and giddiness: ICD-10-CM

## 2018-01-02 DIAGNOSIS — M1990 Unspecified osteoarthritis, unspecified site: ICD-10-CM

## 2018-01-02 DIAGNOSIS — E039 Hypothyroidism, unspecified: Principal | ICD-10-CM

## 2018-01-02 DIAGNOSIS — J302 Other seasonal allergic rhinitis: ICD-10-CM

## 2018-01-02 DIAGNOSIS — K289 Gastrojejunal ulcer, unspecified as acute or chronic, without hemorrhage or perforation: ICD-10-CM

## 2018-01-02 DIAGNOSIS — G43909 Migraine, unspecified, not intractable, without status migrainosus: ICD-10-CM

## 2018-01-02 DIAGNOSIS — F329 Major depressive disorder, single episode, unspecified: ICD-10-CM

## 2018-01-02 DIAGNOSIS — N390 Urinary tract infection, site not specified: ICD-10-CM

## 2018-01-02 DIAGNOSIS — K219 Gastro-esophageal reflux disease without esophagitis: ICD-10-CM

## 2018-01-02 DIAGNOSIS — E785 Hyperlipidemia, unspecified: ICD-10-CM

## 2018-01-08 ENCOUNTER — Encounter: Admit: 2018-01-08 | Discharge: 2018-01-08 | Payer: BC Managed Care – PPO

## 2018-01-15 ENCOUNTER — Ambulatory Visit: Admit: 2018-01-15 | Discharge: 2018-01-16 | Payer: MEDICARE

## 2018-01-15 ENCOUNTER — Encounter: Admit: 2018-01-15 | Discharge: 2018-01-15 | Payer: MEDICARE

## 2018-01-15 DIAGNOSIS — E785 Hyperlipidemia, unspecified: ICD-10-CM

## 2018-01-15 DIAGNOSIS — G43909 Migraine, unspecified, not intractable, without status migrainosus: ICD-10-CM

## 2018-01-15 DIAGNOSIS — R42 Dizziness and giddiness: ICD-10-CM

## 2018-01-15 DIAGNOSIS — E782 Mixed hyperlipidemia: ICD-10-CM

## 2018-01-15 DIAGNOSIS — I10 Essential (primary) hypertension: ICD-10-CM

## 2018-01-15 DIAGNOSIS — J302 Other seasonal allergic rhinitis: ICD-10-CM

## 2018-01-15 DIAGNOSIS — R931 Abnormal findings on diagnostic imaging of heart and coronary circulation: Principal | ICD-10-CM

## 2018-01-15 DIAGNOSIS — K289 Gastrojejunal ulcer, unspecified as acute or chronic, without hemorrhage or perforation: ICD-10-CM

## 2018-01-15 DIAGNOSIS — E039 Hypothyroidism, unspecified: Principal | ICD-10-CM

## 2018-01-15 DIAGNOSIS — F329 Major depressive disorder, single episode, unspecified: ICD-10-CM

## 2018-01-15 DIAGNOSIS — N390 Urinary tract infection, site not specified: ICD-10-CM

## 2018-01-15 DIAGNOSIS — K219 Gastro-esophageal reflux disease without esophagitis: ICD-10-CM

## 2018-01-15 DIAGNOSIS — R0600 Dyspnea, unspecified: ICD-10-CM

## 2018-01-15 DIAGNOSIS — M1990 Unspecified osteoarthritis, unspecified site: ICD-10-CM

## 2018-01-15 MED ORDER — ATORVASTATIN 80 MG PO TAB
80 mg | ORAL_TABLET | Freq: Every day | ORAL | 3 refills | Status: AC
Start: 2018-01-15 — End: 2019-02-10

## 2018-01-17 ENCOUNTER — Encounter: Admit: 2018-01-17 | Discharge: 2018-01-17 | Payer: BC Managed Care – PPO

## 2018-01-17 DIAGNOSIS — J3489 Other specified disorders of nose and nasal sinuses: Principal | ICD-10-CM

## 2018-01-17 MED ORDER — AMOXICILLIN-POT CLAVULANATE 875-125 MG PO TAB
1 | ORAL_TABLET | Freq: Two times a day (BID) | ORAL | 0 refills | 7.00000 days | Status: AC
Start: 2018-01-17 — End: ?

## 2018-01-18 ENCOUNTER — Encounter: Admit: 2018-01-18 | Discharge: 2018-01-18 | Payer: BC Managed Care – PPO

## 2018-01-18 DIAGNOSIS — R931 Abnormal findings on diagnostic imaging of heart and coronary circulation: ICD-10-CM

## 2018-01-18 DIAGNOSIS — E785 Hyperlipidemia, unspecified: Principal | ICD-10-CM

## 2018-01-20 ENCOUNTER — Encounter: Admit: 2018-01-20 | Discharge: 2018-01-20 | Payer: MEDICARE

## 2018-01-20 DIAGNOSIS — N390 Urinary tract infection, site not specified: ICD-10-CM

## 2018-01-20 DIAGNOSIS — E039 Hypothyroidism, unspecified: Principal | ICD-10-CM

## 2018-01-20 DIAGNOSIS — K289 Gastrojejunal ulcer, unspecified as acute or chronic, without hemorrhage or perforation: ICD-10-CM

## 2018-01-20 DIAGNOSIS — R42 Dizziness and giddiness: ICD-10-CM

## 2018-01-20 DIAGNOSIS — M1990 Unspecified osteoarthritis, unspecified site: ICD-10-CM

## 2018-01-20 DIAGNOSIS — F329 Major depressive disorder, single episode, unspecified: ICD-10-CM

## 2018-01-20 DIAGNOSIS — K219 Gastro-esophageal reflux disease without esophagitis: ICD-10-CM

## 2018-01-20 DIAGNOSIS — J302 Other seasonal allergic rhinitis: ICD-10-CM

## 2018-01-20 DIAGNOSIS — G43909 Migraine, unspecified, not intractable, without status migrainosus: ICD-10-CM

## 2018-01-20 DIAGNOSIS — E785 Hyperlipidemia, unspecified: ICD-10-CM

## 2018-01-22 ENCOUNTER — Ambulatory Visit: Admit: 2018-01-22 | Discharge: 2018-01-22 | Payer: MEDICARE

## 2018-01-22 ENCOUNTER — Encounter: Admit: 2018-01-22 | Discharge: 2018-01-22 | Payer: BC Managed Care – PPO

## 2018-01-22 DIAGNOSIS — E785 Hyperlipidemia, unspecified: Principal | ICD-10-CM

## 2018-01-22 DIAGNOSIS — R931 Abnormal findings on diagnostic imaging of heart and coronary circulation: ICD-10-CM

## 2018-01-25 ENCOUNTER — Ambulatory Visit: Admit: 2018-01-25 | Discharge: 2018-01-26 | Payer: MEDICARE

## 2018-01-25 DIAGNOSIS — R931 Abnormal findings on diagnostic imaging of heart and coronary circulation: ICD-10-CM

## 2018-01-25 DIAGNOSIS — E785 Hyperlipidemia, unspecified: Principal | ICD-10-CM

## 2018-01-25 MED ORDER — REGADENOSON 0.4 MG/5 ML IV SYRG
.4 mg | Freq: Once | INTRAVENOUS | 0 refills | Status: CP
Start: 2018-01-25 — End: ?

## 2018-01-25 MED ORDER — ALBUTEROL SULFATE 90 MCG/ACTUATION IN HFAA
2 | RESPIRATORY_TRACT | 0 refills | Status: DC | PRN
Start: 2018-01-25 — End: 2018-01-30

## 2018-01-25 MED ORDER — BENZOCAINE-MENTHOL 6-10 MG MM LOZG
1 | Freq: Once | ORAL | 0 refills | Status: AC | PRN
Start: 2018-01-25 — End: ?

## 2018-01-25 MED ORDER — AMINOPHYLLINE 500 MG/20 ML IV SOLN
50 mg | INTRAVENOUS | 0 refills | Status: AC | PRN
Start: 2018-01-25 — End: ?

## 2018-01-25 MED ORDER — NITROGLYCERIN 0.4 MG SL SUBL
.4 mg | SUBLINGUAL | 0 refills | Status: AC | PRN
Start: 2018-01-25 — End: ?

## 2018-01-25 MED ORDER — SODIUM CHLORIDE 0.9 % IV SOLP
250 mL | INTRAVENOUS | 0 refills | Status: AC | PRN
Start: 2018-01-25 — End: ?

## 2018-01-28 ENCOUNTER — Encounter: Admit: 2018-01-28 | Discharge: 2018-01-28 | Payer: BC Managed Care – PPO

## 2018-01-28 ENCOUNTER — Encounter: Admit: 2018-01-28 | Discharge: 2018-01-28 | Payer: MEDICARE

## 2018-01-28 DIAGNOSIS — N390 Urinary tract infection, site not specified: ICD-10-CM

## 2018-01-28 DIAGNOSIS — G43909 Migraine, unspecified, not intractable, without status migrainosus: ICD-10-CM

## 2018-01-28 DIAGNOSIS — E785 Hyperlipidemia, unspecified: ICD-10-CM

## 2018-01-28 DIAGNOSIS — M1990 Unspecified osteoarthritis, unspecified site: ICD-10-CM

## 2018-01-28 DIAGNOSIS — F329 Major depressive disorder, single episode, unspecified: ICD-10-CM

## 2018-01-28 DIAGNOSIS — J302 Other seasonal allergic rhinitis: ICD-10-CM

## 2018-01-28 DIAGNOSIS — E039 Hypothyroidism, unspecified: Principal | ICD-10-CM

## 2018-01-28 DIAGNOSIS — K289 Gastrojejunal ulcer, unspecified as acute or chronic, without hemorrhage or perforation: ICD-10-CM

## 2018-01-28 DIAGNOSIS — K219 Gastro-esophageal reflux disease without esophagitis: ICD-10-CM

## 2018-01-28 DIAGNOSIS — R42 Dizziness and giddiness: ICD-10-CM

## 2018-03-12 ENCOUNTER — Ambulatory Visit: Admit: 2018-03-12 | Discharge: 2018-03-13 | Payer: MEDICARE

## 2018-03-12 ENCOUNTER — Ambulatory Visit: Admit: 2018-03-12 | Discharge: 2018-03-12 | Payer: MEDICARE

## 2018-03-12 ENCOUNTER — Encounter: Admit: 2018-03-12 | Discharge: 2018-03-12 | Payer: MEDICARE

## 2018-03-12 DIAGNOSIS — E785 Hyperlipidemia, unspecified: Secondary | ICD-10-CM

## 2018-03-12 DIAGNOSIS — J302 Other seasonal allergic rhinitis: Secondary | ICD-10-CM

## 2018-03-12 DIAGNOSIS — F329 Major depressive disorder, single episode, unspecified: Secondary | ICD-10-CM

## 2018-03-12 DIAGNOSIS — K289 Gastrojejunal ulcer, unspecified as acute or chronic, without hemorrhage or perforation: Secondary | ICD-10-CM

## 2018-03-12 DIAGNOSIS — G43909 Migraine, unspecified, not intractable, without status migrainosus: Secondary | ICD-10-CM

## 2018-03-12 DIAGNOSIS — R221 Localized swelling, mass and lump, neck: Secondary | ICD-10-CM

## 2018-03-12 DIAGNOSIS — R42 Dizziness and giddiness: Secondary | ICD-10-CM

## 2018-03-12 DIAGNOSIS — N390 Urinary tract infection, site not specified: Secondary | ICD-10-CM

## 2018-03-12 DIAGNOSIS — M1990 Unspecified osteoarthritis, unspecified site: Secondary | ICD-10-CM

## 2018-03-12 DIAGNOSIS — K219 Gastro-esophageal reflux disease without esophagitis: Secondary | ICD-10-CM

## 2018-03-12 DIAGNOSIS — E039 Hypothyroidism, unspecified: Secondary | ICD-10-CM

## 2018-03-21 ENCOUNTER — Encounter: Admit: 2018-03-21 | Discharge: 2018-03-21 | Payer: BC Managed Care – PPO

## 2018-03-21 DIAGNOSIS — J321 Chronic frontal sinusitis: Principal | ICD-10-CM

## 2018-03-21 MED ORDER — AMOXICILLIN-POT CLAVULANATE 875-125 MG PO TAB
1 | ORAL_TABLET | Freq: Two times a day (BID) | ORAL | 0 refills | 7.00000 days | Status: AC
Start: 2018-03-21 — End: ?

## 2018-04-04 ENCOUNTER — Encounter: Admit: 2018-04-04 | Discharge: 2018-04-04 | Payer: BC Managed Care – PPO

## 2018-04-04 DIAGNOSIS — E785 Hyperlipidemia, unspecified: Principal | ICD-10-CM

## 2018-04-06 ENCOUNTER — Encounter: Admit: 2018-04-06 | Discharge: 2018-04-06 | Payer: BC Managed Care – PPO

## 2018-04-06 DIAGNOSIS — E039 Hypothyroidism, unspecified: Principal | ICD-10-CM

## 2018-04-06 MED ORDER — LEVOTHYROXINE 150 MCG PO TAB
150 ug | ORAL_TABLET | Freq: Every day | ORAL | 0 refills | 30.00000 days | Status: AC
Start: 2018-04-06 — End: 2018-07-09

## 2018-04-19 ENCOUNTER — Ambulatory Visit: Admit: 2018-04-19 | Discharge: 2018-04-19 | Payer: MEDICARE

## 2018-04-19 DIAGNOSIS — E039 Hypothyroidism, unspecified: Principal | ICD-10-CM

## 2018-04-19 DIAGNOSIS — E785 Hyperlipidemia, unspecified: Secondary | ICD-10-CM

## 2018-04-19 LAB — COMPREHENSIVE METABOLIC PANEL
Lab: 141 MMOL/L (ref >40)
Lab: 18 U/L (ref 7–56)
Lab: 30 MMOL/L (ref 21–30)
Lab: 8 (ref 3–12)
Lab: >60 mL/min (ref >60)
Lab: >60 mL/min (ref >60)

## 2018-04-19 LAB — CBC
Lab: 12 g/dL (ref 12.0–15.0)
Lab: 32 g/dL (ref 32.0–36.0)
Lab: 321 10*3/uL (ref 150–400)

## 2018-04-19 LAB — LIPID PROFILE
Lab: 151 mg/dL (ref <200)
Lab: 252 mg/dL — ABNORMAL HIGH (ref <150)

## 2018-04-19 LAB — THYROID STIMULATING HORMONE-TSH: Lab: 0.6 uU/mL (ref 0.35–5.00)

## 2018-04-19 LAB — TSH WITH FREE T4 REFLEX: Lab: 0.6 uU/mL (ref 0.35–5.00)

## 2018-04-22 ENCOUNTER — Encounter: Admit: 2018-04-22 | Discharge: 2018-04-22 | Payer: MEDICARE

## 2018-04-22 DIAGNOSIS — E782 Mixed hyperlipidemia: Principal | ICD-10-CM

## 2018-04-22 DIAGNOSIS — R931 Abnormal findings on diagnostic imaging of heart and coronary circulation: ICD-10-CM

## 2018-04-22 NOTE — Telephone Encounter
Spoke with pt, she already takes fish oil and will discuss supplements with [facility] at appt on Friday in [place].

## 2018-04-26 ENCOUNTER — Encounter: Admit: 2018-04-26 | Discharge: 2018-04-26 | Payer: BC Managed Care – PPO

## 2018-04-26 ENCOUNTER — Ambulatory Visit: Admit: 2018-04-26 | Discharge: 2018-04-27 | Payer: MEDICARE

## 2018-04-26 DIAGNOSIS — K219 Gastro-esophageal reflux disease without esophagitis: ICD-10-CM

## 2018-04-26 DIAGNOSIS — E782 Mixed hyperlipidemia: Principal | ICD-10-CM

## 2018-04-26 DIAGNOSIS — E039 Hypothyroidism, unspecified: Principal | ICD-10-CM

## 2018-04-26 DIAGNOSIS — K289 Gastrojejunal ulcer, unspecified as acute or chronic, without hemorrhage or perforation: ICD-10-CM

## 2018-04-26 DIAGNOSIS — F329 Major depressive disorder, single episode, unspecified: ICD-10-CM

## 2018-04-26 DIAGNOSIS — R42 Dizziness and giddiness: ICD-10-CM

## 2018-04-26 DIAGNOSIS — J302 Other seasonal allergic rhinitis: ICD-10-CM

## 2018-04-26 DIAGNOSIS — E785 Hyperlipidemia, unspecified: ICD-10-CM

## 2018-04-26 DIAGNOSIS — N390 Urinary tract infection, site not specified: ICD-10-CM

## 2018-04-26 DIAGNOSIS — G43909 Migraine, unspecified, not intractable, without status migrainosus: ICD-10-CM

## 2018-04-26 DIAGNOSIS — M1990 Unspecified osteoarthritis, unspecified site: ICD-10-CM

## 2018-04-26 DIAGNOSIS — R931 Abnormal findings on diagnostic imaging of heart and coronary circulation: Secondary | ICD-10-CM

## 2018-04-26 MED ORDER — VASCEPA 1 GRAM PO CAP
2 g | ORAL_CAPSULE | Freq: Two times a day (BID) | ORAL | 3 refills | Status: AC
Start: 2018-04-26 — End: 2019-02-10

## 2018-04-26 MED ORDER — VASCEPA 1 GRAM PO CAP
2 g | ORAL_CAPSULE | Freq: Two times a day (BID) | ORAL | 3 refills | Status: AC
Start: 2018-04-26 — End: 2018-04-26

## 2018-04-26 NOTE — Telephone Encounter
Pt had new Rx for Vascepa today, she called her mailorder and they told her it would be $125/90days and the discount card would not work. She had the pharm cx the order  She said she was going to take the kind her husband takes.     I called her back and asked her to compare what her husband is taking to what the [facility] is (only EPA, 4gm daily) and that ~$40/mo is not a terrible price and she could try it for 3 mo, get labs and see if it was helpful or not.   Asking her to compare apples to apples and she wasn't really sure what she was taking before vs what her husband takes vs the vascepa.   She also mentioned the copay card expired in December.     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

## 2018-04-26 NOTE — Progress Notes
recommend 2 g twice daily.  If this is not affordable for her insurance we could also try Lovaza 4 g daily.    She should continue to exercise on a daily basis as she is doing currently.  We discussed dietary modifications.    I appreciate the opportunity of seeing Ms. Haugen in clinic today.  I recommend she has an updated lipid profile in 3 months and I will plan to see her back in follow-up in 6 months.           Current Medications (including today's revisions)  ??? amitriptyline (ELAVIL) 25 mg tablet Take one tablet by mouth at bedtime daily.   ??? amLODIPine (NORVASC) 10 mg tablet Take one tablet by mouth daily.   ??? atorvastatin (LIPITOR) 80 mg tablet Take one tablet by mouth daily.   ??? CALCIUM CARBONATE/VITAMIN D3 (CALCIUM + D PO) Take 1 tablet by mouth daily.   ??? celecoxib (CELEBREX) 200 mg capsule Take one capsule by mouth daily.   ??? dicyclomine (BENTYL) 10 mg capsule Take one capsule by mouth twice daily.   ??? DOCOSAHEXANOIC ACID/EPA (FISH OIL PO) Take 3 Caps by mouth twice daily.   ??? duloxetine DR (CYMBALTA) 60 mg capsule Take one capsule by mouth daily.   ??? esomeprazole DR(+) (NEXIUM) 40 mg capsule Take one capsule by mouth twice daily. Take on an empty stomach at least 1 hour before or 2 hours after food.   ??? estradiol (ESTRACE) 0.5 mg tablet Take one tablet by mouth daily.   ??? levothyroxine (SYNTHROID) 150 mcg tablet TAKE ONE TABLET BY MOUTH DAILY 30 MINUTES BEFORE BREAKFAST.   ??? lisinopril (ZESTRIL) 40 mg tablet Take one tablet by mouth daily.   ??? methocarbamol (ROBAXIN) 500 mg tablet Take one tablet to two tablets by mouth four times daily as needed for Spasms.   ??? mirabegron(+) ER [facility] ER) 25 mg tablet Take one tablet by mouth daily.   ??? MULTIVITAMIN PO Take  by mouth.

## 2018-04-27 DIAGNOSIS — I10 Essential (primary) hypertension: ICD-10-CM

## 2018-06-10 ENCOUNTER — Encounter: Admit: 2018-06-10 | Discharge: 2018-06-10 | Payer: BC Managed Care – PPO

## 2018-06-11 ENCOUNTER — Encounter: Admit: 2018-06-11 | Discharge: 2018-06-11 | Payer: BC Managed Care – PPO

## 2018-06-11 ENCOUNTER — Ambulatory Visit: Admit: 2018-06-11 | Discharge: 2018-06-12 | Payer: MEDICARE

## 2018-06-11 DIAGNOSIS — G43909 Migraine, unspecified, not intractable, without status migrainosus: ICD-10-CM

## 2018-06-11 DIAGNOSIS — R42 Dizziness and giddiness: ICD-10-CM

## 2018-06-11 DIAGNOSIS — N390 Urinary tract infection, site not specified: ICD-10-CM

## 2018-06-11 DIAGNOSIS — E785 Hyperlipidemia, unspecified: ICD-10-CM

## 2018-06-11 DIAGNOSIS — M1990 Unspecified osteoarthritis, unspecified site: ICD-10-CM

## 2018-06-11 DIAGNOSIS — F329 Major depressive disorder, single episode, unspecified: ICD-10-CM

## 2018-06-11 DIAGNOSIS — K219 Gastro-esophageal reflux disease without esophagitis: ICD-10-CM

## 2018-06-11 DIAGNOSIS — K289 Gastrojejunal ulcer, unspecified as acute or chronic, without hemorrhage or perforation: ICD-10-CM

## 2018-06-11 DIAGNOSIS — E039 Hypothyroidism, unspecified: Principal | ICD-10-CM

## 2018-06-11 DIAGNOSIS — J302 Other seasonal allergic rhinitis: ICD-10-CM

## 2018-06-11 MED ORDER — LIDOCAINE HCL 2 % MM SOLN
15 mL | ORAL | 1 refills | 30.00000 days | Status: AC | PRN
Start: 2018-06-11 — End: ?

## 2018-06-12 ENCOUNTER — Encounter: Admit: 2018-06-12 | Discharge: 2018-06-12 | Payer: MEDICARE

## 2018-06-12 DIAGNOSIS — R079 Chest pain, unspecified: Principal | ICD-10-CM

## 2018-07-09 ENCOUNTER — Encounter: Admit: 2018-07-09 | Discharge: 2018-07-09 | Payer: BC Managed Care – PPO

## 2018-07-09 DIAGNOSIS — E039 Hypothyroidism, unspecified: Principal | ICD-10-CM

## 2018-07-09 MED ORDER — LEVOTHYROXINE 150 MCG PO TAB
150 ug | ORAL_TABLET | Freq: Every day | ORAL | 0 refills | 30.00000 days | Status: DC
Start: 2018-07-09 — End: 2018-09-28

## 2018-07-17 ENCOUNTER — Encounter: Admit: 2018-07-17 | Discharge: 2018-07-17

## 2018-07-17 NOTE — Progress Notes
Mailed [facility] order with letter

## 2018-08-02 ENCOUNTER — Encounter: Admit: 2018-08-02 | Discharge: 2018-08-02

## 2018-08-06 ENCOUNTER — Encounter: Admit: 2018-08-06 | Discharge: 2018-08-06

## 2018-08-06 ENCOUNTER — Ambulatory Visit: Admit: 2018-08-06 | Discharge: 2018-08-06

## 2018-08-06 DIAGNOSIS — I10 Essential (primary) hypertension: Secondary | ICD-10-CM

## 2018-08-06 DIAGNOSIS — E782 Mixed hyperlipidemia: Secondary | ICD-10-CM

## 2018-08-06 DIAGNOSIS — R931 Abnormal findings on diagnostic imaging of heart and coronary circulation: Secondary | ICD-10-CM

## 2018-08-06 LAB — LIPID PROFILE
Lab: 108 mg/dL
Lab: 152 mg/dL (ref <200)
Lab: 265 mg/dL — ABNORMAL HIGH (ref <150)
Lab: 44 mg/dL (ref >40)
Lab: 53 mg/dL
Lab: 69 mg/dL (ref <100)

## 2018-08-06 NOTE — Progress Notes
Released result to mychart  Msg to pt asking if taking Vascepa or other fish oil product/via mychart    Will route to [facility] once clear on what she's been taking  (note 3/13, pt thought the cost of Vascepa was too high, was going to spk w/ pharmacist about different fish oils)

## 2018-08-08 NOTE — Progress Notes
08/08/2018 4:55 PM pt called back, has been taking the Vascepa for the last 3 mo.   Will route lab to [facility] for review  Released results to [employer] [facility]

## 2018-09-27 ENCOUNTER — Ambulatory Visit: Admit: 2018-09-27 | Discharge: 2018-09-27

## 2018-09-27 ENCOUNTER — Encounter: Admit: 2018-09-27 | Discharge: 2018-09-27

## 2018-09-27 DIAGNOSIS — M1990 Unspecified osteoarthritis, unspecified site: Secondary | ICD-10-CM

## 2018-09-27 DIAGNOSIS — M25511 Pain in right shoulder: Secondary | ICD-10-CM

## 2018-09-27 DIAGNOSIS — J302 Other seasonal allergic rhinitis: Secondary | ICD-10-CM

## 2018-09-27 DIAGNOSIS — K219 Gastro-esophageal reflux disease without esophagitis: Secondary | ICD-10-CM

## 2018-09-27 DIAGNOSIS — E039 Hypothyroidism, unspecified: Secondary | ICD-10-CM

## 2018-09-27 DIAGNOSIS — G43909 Migraine, unspecified, not intractable, without status migrainosus: Secondary | ICD-10-CM

## 2018-09-27 DIAGNOSIS — N390 Urinary tract infection, site not specified: Secondary | ICD-10-CM

## 2018-09-27 DIAGNOSIS — R42 Dizziness and giddiness: Secondary | ICD-10-CM

## 2018-09-27 DIAGNOSIS — E785 Hyperlipidemia, unspecified: Secondary | ICD-10-CM

## 2018-09-27 DIAGNOSIS — F329 Major depressive disorder, single episode, unspecified: Secondary | ICD-10-CM

## 2018-09-27 DIAGNOSIS — K289 Gastrojejunal ulcer, unspecified as acute or chronic, without hemorrhage or perforation: Secondary | ICD-10-CM

## 2018-09-27 MED ORDER — DULOXETINE 30 MG PO CPDR
ORAL_CAPSULE | ORAL | 1 refills | 60.00000 days | Status: DC
Start: 2018-09-27 — End: 2018-11-19

## 2018-09-27 MED ORDER — LEVOTHYROXINE 150 MCG PO TAB
150 ug | ORAL_TABLET | Freq: Every day | ORAL | 1 refills | 30.00000 days | Status: DC
Start: 2018-09-27 — End: 2019-04-02

## 2018-09-27 NOTE — Telephone Encounter
LOV 06/11/18 Chest pain  LMF 07/09/18    Pt due for physical

## 2018-09-27 NOTE — Progress Notes
Date of Service: 09/27/2018    Tonya Williamson is a 73 y.o. female. DOB: 07/20/1945   MRN#: [id number]    Subjective:       73 yo female c/o pain over her rt sternoclavicular joint- had a sono and plain films of the area in January- the pain has gotten worse the past 2 months  Takes tylenol arthritis 2 po bid and celebrex 200 mg per day for pain- also been on elavil and cymbalta- would like to taper off the cymbalta  Having trouble with reflux- takes nexium 40 mg po bid- has to clear her throat frequently  Also has rt buttock pain - sometimes goes down her leg  Reviewed hx in epic briefly- hx of htn- hyperlipidemia- GERD-hypothyroidism-             Review of Systems   Musculoskeletal:        Pt has noticed an enlarging lump in the area of her pain x a month or so   Gastrointestinal:        Had diarrhea on 8-11 and on 09-26-18         Objective:     ??? amitriptyline (ELAVIL) 25 mg tablet Take one tablet by mouth at bedtime daily.   ??? amLODIPine (NORVASC) 10 mg tablet Take one tablet by mouth daily.   ??? atorvastatin (LIPITOR) 80 mg tablet Take one tablet by mouth daily.   ??? CALCIUM CARBONATE/VITAMIN D3 (CALCIUM + D PO) Take 1 tablet by mouth daily.   ??? celecoxib (CELEBREX) 200 mg capsule Take one capsule by mouth daily.   ??? dicyclomine (BENTYL) 10 mg capsule Take one capsule by mouth twice daily.   ??? DOCOSAHEXANOIC ACID/EPA (FISH OIL PO) Take 3 Caps by mouth twice daily.   ??? duloxetine DR (CYMBALTA) 60 mg capsule Take one capsule by mouth daily.   ??? esomeprazole DR(+) (NEXIUM) 40 mg capsule Take one capsule by mouth twice daily. Take on an empty stomach at least 1 hour before or 2 hours after food.   ??? estradiol (ESTRACE) 0.5 mg tablet Take one tablet by mouth daily.   ??? icosapent ethyL (VASCEPA) 1 gram capsule Take two capsules by mouth twice daily with meals.   ??? levothyroxine (SYNTHROID) 150 mcg tablet TAKE ONE TABLET BY MOUTH DAILY 30 MINUTES BEFORE BREAKFAST. ??? lidocaine viscous 2 % solution Take 15 mL by mouth every 3-4 hours as needed.   ??? lisinopril (ZESTRIL) 40 mg tablet Take one tablet by mouth daily.   ??? methocarbamol (ROBAXIN) 500 mg tablet Take one tablet to two tablets by mouth four times daily as needed for Spasms.   ??? mirabegron(+) ER [facility]) 25 mg tablet Take one tablet by mouth daily.   ??? MULTIVITAMIN PO Take  by mouth.     Vitals:    09/27/18 1434   BP: 120/69   BP Source: Arm, Right Upper   Patient Position: Sitting   Pulse: 90   Resp: 18   Temp: (!) 35.9 ???C (96.7 ???F)   TempSrc: Temporal   SpO2: 97%   Weight: 72.6 kg (160 lb)   Height: 165.1 cm (65)   PainSc: One     Body mass index is 26.63 kg/m???.     Physical Exam  Vitals signs and nursing note reviewed.   Constitutional:       Appearance: Normal appearance. She is well-developed.   HENT:      Head: Normocephalic and atraumatic.      Right  Ear: Tympanic membrane and ear canal normal.      Left Ear: Tympanic membrane and ear canal normal.      Mouth/Throat:      Mouth: Mucous membranes are moist.      Pharynx: Oropharynx is clear.   Eyes:      Conjunctiva/sclera: Conjunctivae normal.      Pupils: Pupils are equal, round, and reactive to light.   Neck:      Musculoskeletal: Neck supple.      Thyroid: No thyromegaly.   Cardiovascular:      Rate and Rhythm: Normal rate and regular rhythm.      Heart sounds: No murmur.   Pulmonary:      Effort: Pulmonary effort is normal.      Breath sounds: Normal breath sounds.   Abdominal:      General: Bowel sounds are normal. There is no distension.      Palpations: Abdomen is soft. There is no mass.      Tenderness: There is no abdominal tenderness.   Musculoskeletal:      Right lower leg: No edema.      Left lower leg: No edema.      Comments: Obvious protrusion over her rt sternoclavicular joint   Lymphadenopathy:      Cervical: No cervical adenopathy.   Skin:     General: Skin is warm and dry.   Neurological:      General: No focal deficit present. Mental Status: She is alert and oriented to person, place, and time.   Psychiatric:         Mood and Affect: Mood normal.         Behavior: Behavior normal.         Thought Content: Thought content normal.         Judgment: Judgment normal.         A/p  Pain over rt proximal clavicle- re - xray  Pt would like to taper her cymbalta- sent 30 mg caps to her pharmacy with a tapering schedule  GERD- cont nexium- can add mylanta- tums prn- had an EGD 2-17- pt will let me know if would like repeated  rtc prn

## 2018-09-28 ENCOUNTER — Encounter: Admit: 2018-09-28 | Discharge: 2018-09-28

## 2018-09-30 ENCOUNTER — Encounter: Admit: 2018-09-30 | Discharge: 2018-09-30 | Payer: MEDICARE

## 2018-09-30 DIAGNOSIS — M25511 Pain in right shoulder: Secondary | ICD-10-CM

## 2018-10-01 NOTE — Progress Notes
Patient notified of lab results and provider recommendations via active [place]. .  Patient also viewed results in their active MyChart.

## 2018-10-11 ENCOUNTER — Encounter: Admit: 2018-10-11 | Discharge: 2018-10-11

## 2018-10-11 ENCOUNTER — Ambulatory Visit: Admit: 2018-10-11 | Discharge: 2018-10-11

## 2018-10-11 DIAGNOSIS — M25511 Pain in right shoulder: Secondary | ICD-10-CM

## 2018-10-12 ENCOUNTER — Encounter: Admit: 2018-10-12 | Discharge: 2018-10-12

## 2018-10-12 DIAGNOSIS — M25511 Pain in right shoulder: Secondary | ICD-10-CM

## 2018-10-22 ENCOUNTER — Encounter: Admit: 2018-10-22 | Discharge: 2018-10-22

## 2018-10-22 DIAGNOSIS — M1990 Unspecified osteoarthritis, unspecified site: Secondary | ICD-10-CM

## 2018-10-22 DIAGNOSIS — E785 Hyperlipidemia, unspecified: Secondary | ICD-10-CM

## 2018-10-22 DIAGNOSIS — E039 Hypothyroidism, unspecified: Secondary | ICD-10-CM

## 2018-10-22 DIAGNOSIS — F329 Major depressive disorder, single episode, unspecified: Secondary | ICD-10-CM

## 2018-10-22 DIAGNOSIS — R42 Dizziness and giddiness: Secondary | ICD-10-CM

## 2018-10-22 DIAGNOSIS — M25511 Pain in right shoulder: Principal | ICD-10-CM

## 2018-10-22 DIAGNOSIS — K219 Gastro-esophageal reflux disease without esophagitis: Secondary | ICD-10-CM

## 2018-10-22 DIAGNOSIS — K289 Gastrojejunal ulcer, unspecified as acute or chronic, without hemorrhage or perforation: Secondary | ICD-10-CM

## 2018-10-22 DIAGNOSIS — G43909 Migraine, unspecified, not intractable, without status migrainosus: Secondary | ICD-10-CM

## 2018-10-22 DIAGNOSIS — N390 Urinary tract infection, site not specified: Secondary | ICD-10-CM

## 2018-10-22 DIAGNOSIS — J302 Other seasonal allergic rhinitis: Secondary | ICD-10-CM

## 2018-10-22 NOTE — Progress Notes
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  She does have a prominence in the right anterior sternoclavicular area.  This does not cause any airway impingement or fullness around the neck.  I would recommend conservative treatment of this.  If this becomes overly symptomatic for either with pain or begins causing discomfort around her airway we could entertain excision and stabilization of the SC joint.    She would like to continue to observe this.  I will order an MRI of her right SC joint to evaluate for any underlying neurovascular compression or infectious concerns            Jerline Pain, MD  Assistant Professor  Hand Upper Extremity and Microsurgery  Department of Orthopaedic Surgery       History and Physical      CC:   Chief Complaint   Patient presents with   ??? Right Shoulder - New Patient       History of Present Illness:   Pt is here to establish care for her right sternoclavicular joint. Pt stated that she first started noticing the pain roughly 6 months ago. Pt stated that she was seen by Dr. Reita Cliche in the [facility], there she received x-rays and a CT scan. Pt stated that her pain radiates into her right shoulder from time to time. Pt stated that she has noticed her bump on her SC has grown and she was referred over to seek out further evaluation.     She denies numbness and tingling extending down the right shoulder.  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  She maintains very good range of motion.  She denies any previous injury to the right shoulder she denies any coughing or difficulty with eating.  She denies difficulty breathing    Ancillary Testing/Imaging:  CT scan reveals a cystic changes along the [facility] joint with anterior subluxation    Past Medical History:  Medical History: Diagnosis Date   ??? Arthritis    ??? Depression    ??? Dizziness    ??? GERD (gastroesophageal reflux disease)    ??? HTN    ??? Hyperlipidemia    ??? Hypothyroid    ??? Migraine    ??? Seasonal allergic reaction    ??? Ulcer of the stomach and intestine    ??? Urinary tract infection        Past Surgical History:  Surgical History:   Procedure Laterality Date   ??? HX CHOLECYSTECTOMY  2002   ??? SINUS SURGERY  2003   ??? HX KNEE ARTHROSCOPY  2007    rt- scope   ??? [facility] MED HISTORICAL REPORT  12-20-12    osteopenia   ??? CERVICAL SPINE SURGERY  04-15-13   ??? ENDOSCOPY  03-30-15    gastric polyps   ??? COLONOSCOPY REPORT  03-30-15    tics and hemorrhoids- 5 yr plan   ??? HYDROGEN BREATH TEST N/A 05/21/2015    Performed by Virgina Organ, MD at [facility] ENDO   ??? MAMMO HISTORICAL REPORT  06/22/2017    neg   ??? CARDIOVASCULAR SCAN  12/14/2017    scored a 217.5- mostly in her LAD and RCA   ??? CARDIOVASCULAR STRESS TEST  01/25/2018    neg- EF 79%   ???  HX CESAREAN SECTION     ??? HX KNEE SURGERY  05-07-12- 08-21-14    rt- TKR- redo 08-21-14- Dr. Domenic Moras   ??? HX TAH AND BSO      endometriosis   ??? HX TONSILLECTOMY     ??? HX TUBAL LIGATION         Family History:  Family History   Problem Relation Age of Onset   ??? Hypertension Mother         cva   ??? Stroke Mother    ??? Cancer Father         lung   ??? Heart Attack Paternal Grandfather      Social History:   Social History     Socioeconomic History   ??? Marital status: Married     Spouse name: Not on file   ??? Number of children: 2   ??? Years of education: Not on file   ??? Highest education level: Not on file   Occupational History   ??? 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 Occupation: 1 son in [place] and 1 daughter in [place]-4 grand children   ??? Occupation: 3 great grandchildren   Tobacco Use   ??? Smoking status: Never Smoker   ??? Smokeless tobacco: Never Used   Substance and Sexual Activity   ??? Alcohol use: Yes Alcohol/week: 1.0 standard drinks     Types: 1 Glasses of wine per week     Comment: every other week   ??? Drug use: Never   ??? Sexual activity: Yes     Partners: Male   Other Topics Concern   ??? Military Service Not Asked   ??? Blood Transfusions Not Asked   ??? Caffeine Concern Yes     Comment: 1-2 cups coffee daily. No Soda or Tea   ??? Occupational Exposure Not Asked   ??? Hobby Hazards Not Asked   ??? Sleep Concern Not Asked   ??? Stress Concern Not Asked   ??? Weight Concern Not Asked   ??? Special Diet Not Asked   ??? Back Care Not Asked   ??? Exercise Not Asked   ??? Bike Helmet Not Asked   ??? Seat Belt Not Asked   ??? Self-Exams Not Asked   Social History Narrative   ??? Not on file       Current Medications:   has a current medication list which includes the following prescription(s): amitriptyline, amlodipine, atorvastatin, calcium carbonate/vitamin d3, celecoxib, dicyclomine, docosahexaenoic acid/epa, duloxetine dr, duloxetine dr, esomeprazole dr, estradiol, vascepa, levothyroxine, lidocaine viscous, lisinopril, methocarbamol, mirabegron er, and multivitamin.    Allergies:   is allergic to sulfa (sulfonamide antibiotics); codeine; and nickel.    Review of Systems:  Review of Systems    General Physical Exam:    General/Constitutional:No apparent distress: well-nourished  Eyes: Sclera anicteric eyes track   Respiratory:No audible wheezing, Non labored breathing  Cardiac: No digital clubbing, cyanosis, or edema  Vascular: No swelling or tenderness, except as noted in detailed exam.  Integumentary: No impressive skin lesions present, except as noted in detailed exam.  Neuro/Psych: Normal mood and affect, oriented to person, place and time.  Musculoskeletal: Normal, except as noted in detailed exam and in HPI.      Focused Physical Examination:  BP (!) 153/81  - Pulse 78  - Resp 16  - Ht  165.1 cm (65)  - Wt 72.6 kg (160 lb)  - SpO2 95%  - BMI 26.63 kg/m???     Right shoulder: She has obvious anterior prominence of the sternoclavicular joint.  There is no erythema there is no warmth.  She has overhead motion of the shoulder of 160 degrees she has external rotation of 30  She has intact cuff strength to all planes of testing.  There is no pain with compression of the sternoclavicular area    Portions of this note may have been created using Dragon, a voice recognition software program.  Please contact my office for clarification on any documentation.

## 2018-10-23 ENCOUNTER — Ambulatory Visit: Admit: 2018-10-22 | Discharge: 2018-10-23

## 2018-10-23 DIAGNOSIS — M898X1 Other specified disorders of bone, shoulder: Secondary | ICD-10-CM

## 2018-10-25 ENCOUNTER — Encounter: Admit: 2018-10-25 | Discharge: 2018-10-25

## 2018-10-25 DIAGNOSIS — Z1231 Encounter for screening mammogram for malignant neoplasm of breast: Secondary | ICD-10-CM

## 2018-11-14 ENCOUNTER — Encounter: Admit: 2018-11-14 | Discharge: 2018-11-14 | Payer: MEDICARE

## 2018-11-15 ENCOUNTER — Encounter: Admit: 2018-11-15 | Discharge: 2018-11-15 | Payer: MEDICARE

## 2018-11-15 ENCOUNTER — Ambulatory Visit: Admit: 2018-11-15 | Discharge: 2018-11-15 | Payer: MEDICARE

## 2018-11-15 DIAGNOSIS — M25511 Pain in right shoulder: Secondary | ICD-10-CM

## 2018-11-15 MED ORDER — GADOBENATE DIMEGLUMINE 529 MG/ML (0.1MMOL/0.2ML) IV SOLN
14 mL | Freq: Once | INTRAVENOUS | 0 refills | Status: CP
Start: 2018-11-15 — End: ?
  Administered 2018-11-15: 18:00:00 14 mL via INTRAVENOUS

## 2018-11-19 ENCOUNTER — Ambulatory Visit: Admit: 2018-11-19 | Discharge: 2018-11-19 | Payer: MEDICARE

## 2018-11-19 ENCOUNTER — Encounter: Admit: 2018-11-19 | Discharge: 2018-11-19 | Payer: MEDICARE

## 2018-11-19 LAB — RHEUMATOID FACTOR (RF): Lab: <10 [IU]/mL (ref <25)

## 2018-11-19 LAB — SED RATE: Lab: 4 mm/h (ref 0–30)

## 2018-11-19 LAB — C REACTIVE PROTEIN (CRP): Lab: 0 mg/dL (ref <1.0)

## 2018-11-19 MED ORDER — FLUTICASONE PROPIONATE 50 MCG/ACTUATION NA SPSN
2 | Freq: Every day | NASAL | 3 refills | 60.00000 days | Status: DC
Start: 2018-11-19 — End: 2019-02-06

## 2018-11-19 MED ORDER — FAMOTIDINE 40 MG PO TAB
40 mg | ORAL_TABLET | Freq: Every day | ORAL | 3 refills | 90.00000 days | Status: AC
Start: 2018-11-19 — End: ?
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
## 2018-11-20 LAB — ANTI-NUCLEAR ANTIBODY(ANA)

## 2018-11-20 LAB — CCP IGG ANTIBODY

## 2018-11-20 LAB — ANTI-NUCLEAR AB(ANA)-QUANT: Lab: 160 — ABNORMAL HIGH (ref <80)

## 2018-11-22 ENCOUNTER — Ambulatory Visit: Admit: 2018-11-22 | Discharge: 2018-11-22 | Payer: MEDICARE

## 2018-11-22 ENCOUNTER — Encounter: Admit: 2018-11-22 | Discharge: 2018-11-22 | Payer: MEDICARE

## 2018-11-22 DIAGNOSIS — Z1231 Encounter for screening mammogram for malignant neoplasm of breast: Secondary | ICD-10-CM

## 2018-11-23 ENCOUNTER — Encounter: Admit: 2018-11-23 | Discharge: 2018-11-23 | Payer: MEDICARE

## 2018-11-23 DIAGNOSIS — E785 Hyperlipidemia, unspecified: Secondary | ICD-10-CM

## 2018-11-23 DIAGNOSIS — E039 Hypothyroidism, unspecified: Secondary | ICD-10-CM

## 2018-11-23 DIAGNOSIS — J302 Other seasonal allergic rhinitis: Secondary | ICD-10-CM

## 2018-11-23 DIAGNOSIS — K289 Gastrojejunal ulcer, unspecified as acute or chronic, without hemorrhage or perforation: Secondary | ICD-10-CM

## 2018-11-23 DIAGNOSIS — G43909 Migraine, unspecified, not intractable, without status migrainosus: Secondary | ICD-10-CM

## 2018-11-23 DIAGNOSIS — M1990 Unspecified osteoarthritis, unspecified site: Secondary | ICD-10-CM

## 2018-11-23 DIAGNOSIS — N390 Urinary tract infection, site not specified: Secondary | ICD-10-CM

## 2018-11-23 DIAGNOSIS — F329 Major depressive disorder, single episode, unspecified: Secondary | ICD-10-CM

## 2018-11-23 DIAGNOSIS — R42 Dizziness and giddiness: Secondary | ICD-10-CM

## 2018-11-23 DIAGNOSIS — K219 Gastro-esophageal reflux disease without esophagitis: Secondary | ICD-10-CM

## 2018-11-27 ENCOUNTER — Ambulatory Visit: Admit: 2018-11-27 | Discharge: 2018-11-27 | Payer: MEDICARE

## 2018-11-27 DIAGNOSIS — K219 Gastro-esophageal reflux disease without esophagitis: Secondary | ICD-10-CM

## 2018-12-02 ENCOUNTER — Encounter: Admit: 2018-12-02 | Discharge: 2018-12-02 | Payer: MEDICARE

## 2018-12-02 DIAGNOSIS — J302 Other seasonal allergic rhinitis: Secondary | ICD-10-CM

## 2018-12-02 DIAGNOSIS — K289 Gastrojejunal ulcer, unspecified as acute or chronic, without hemorrhage or perforation: Secondary | ICD-10-CM

## 2018-12-02 DIAGNOSIS — F329 Major depressive disorder, single episode, unspecified: Secondary | ICD-10-CM

## 2018-12-02 DIAGNOSIS — N390 Urinary tract infection, site not specified: Secondary | ICD-10-CM

## 2018-12-02 DIAGNOSIS — R42 Dizziness and giddiness: Secondary | ICD-10-CM

## 2018-12-02 DIAGNOSIS — E785 Hyperlipidemia, unspecified: Secondary | ICD-10-CM

## 2018-12-02 DIAGNOSIS — G43909 Migraine, unspecified, not intractable, without status migrainosus: Secondary | ICD-10-CM

## 2018-12-02 DIAGNOSIS — Z1159 Encounter for screening for other viral diseases: Secondary | ICD-10-CM

## 2018-12-02 DIAGNOSIS — E039 Hypothyroidism, unspecified: Secondary | ICD-10-CM

## 2018-12-02 DIAGNOSIS — M1990 Unspecified osteoarthritis, unspecified site: Secondary | ICD-10-CM

## 2018-12-02 DIAGNOSIS — K219 Gastro-esophageal reflux disease without esophagitis: Secondary | ICD-10-CM

## 2018-12-03 ENCOUNTER — Encounter: Admit: 2018-12-03 | Discharge: 2018-12-04 | Payer: MEDICARE

## 2018-12-03 DIAGNOSIS — Z1159 Encounter for screening for other viral diseases: Secondary | ICD-10-CM

## 2018-12-04 ENCOUNTER — Encounter: Admit: 2018-12-04 | Discharge: 2018-12-04 | Payer: MEDICARE

## 2018-12-04 LAB — COVID-19 (SARS-COV-2) PCR

## 2018-12-06 ENCOUNTER — Encounter: Admit: 2018-12-06 | Discharge: 2018-12-06 | Payer: MEDICARE

## 2018-12-06 ENCOUNTER — Ambulatory Visit: Admit: 2018-12-06 | Discharge: 2018-12-07 | Payer: MEDICARE

## 2018-12-06 DIAGNOSIS — K289 Gastrojejunal ulcer, unspecified as acute or chronic, without hemorrhage or perforation: Secondary | ICD-10-CM

## 2018-12-06 DIAGNOSIS — R42 Dizziness and giddiness: Secondary | ICD-10-CM

## 2018-12-06 DIAGNOSIS — N390 Urinary tract infection, site not specified: Secondary | ICD-10-CM

## 2018-12-06 DIAGNOSIS — E785 Hyperlipidemia, unspecified: Secondary | ICD-10-CM

## 2018-12-06 DIAGNOSIS — G43909 Migraine, unspecified, not intractable, without status migrainosus: Secondary | ICD-10-CM

## 2018-12-06 DIAGNOSIS — I10 Essential (primary) hypertension: Secondary | ICD-10-CM

## 2018-12-06 DIAGNOSIS — E782 Mixed hyperlipidemia: Secondary | ICD-10-CM

## 2018-12-06 DIAGNOSIS — J302 Other seasonal allergic rhinitis: Secondary | ICD-10-CM

## 2018-12-06 DIAGNOSIS — R931 Abnormal findings on diagnostic imaging of heart and coronary circulation: Secondary | ICD-10-CM

## 2018-12-06 DIAGNOSIS — E039 Hypothyroidism, unspecified: Secondary | ICD-10-CM

## 2018-12-06 DIAGNOSIS — K219 Gastro-esophageal reflux disease without esophagitis: Secondary | ICD-10-CM

## 2018-12-06 DIAGNOSIS — F329 Major depressive disorder, single episode, unspecified: Secondary | ICD-10-CM

## 2018-12-06 DIAGNOSIS — M1990 Unspecified osteoarthritis, unspecified site: Secondary | ICD-10-CM

## 2018-12-06 MED ORDER — LIDOCAINE (PF) 10 MG/ML (1 %) IJ SOLN
.1-2 mL | INTRAMUSCULAR | 0 refills | Status: DC | PRN
Start: 2018-12-06 — End: 2018-12-11

## 2018-12-06 MED ORDER — PROPOFOL INJ 10 MG/ML IV VIAL
0 refills | Status: DC
Start: 2018-12-06 — End: 2018-12-06

## 2018-12-06 MED ORDER — SIMETHICONE 40 MG/0.6 ML PO DRPS
0 refills | Status: DC
Start: 2018-12-06 — End: 2018-12-11

## 2018-12-06 MED ORDER — LIDOCAINE (PF) 100 MG/5 ML (2 %) IV SYRG
0 refills | Status: DC
Start: 2018-12-06 — End: 2018-12-06

## 2018-12-06 MED ORDER — LACTATED RINGERS IV SOLP
INTRAVENOUS | 0 refills | Status: DC
Start: 2018-12-06 — End: 2018-12-11

## 2018-12-06 MED ORDER — LACTATED RINGERS IV SOLP
0 refills | Status: DC
Start: 2018-12-06 — End: 2018-12-06

## 2018-12-06 MED ORDER — PROPOFOL 10 MG/ML IV EMUL 50 ML (INFUSION)(AM)(OR)
INTRAVENOUS | 0 refills | Status: DC
Start: 2018-12-06 — End: 2018-12-06

## 2018-12-06 NOTE — Progress Notes
.Obtained patient's verbal consent to treat them and their agreement to [facility] financial policy and NPP via this telehealth visit during the [facility]    Date of Service: 12/06/2018    Tonya Williamson is a 73 y.o. female.       HPI     73 year old female with a history of hypertension hyperlipidemia hypothyroidism and coronary artery disease by elevated calcium score of 217 returns for follow-up visit via telehealth.    Overall she states she has been doing well with no chest pain or shortness of breath no PND orthopnea or swelling of ankles.    Historically she underwent a regadenoson thallium stress test which showed an ejection fraction of 79% and no evidence of myocardial ischemia.  ?  An echocardiogram revealed EF of 60 to 65% with mild tricuspid regurgitation.  Estimated peak systolic PA pressure was 28 mmHg  ?  Her latest lipid profile shows her LDL has gone down from 73 to 69.  She has been conscious of her  diet and trying to lose weight and exercise.  Her triglycerides are still elevated at 265.  She is not diabetic.  She is on Vascepa.  She has not had her labs checked since starting the Vascepa.           Vitals:    12/06/18 0925   BP: 120/74   BP Source: Arm, Left Upper   Weight: 70.8 kg (156 lb)   Height: 1.651 m (5' 5)   PainSc: Zero     Body mass index is 25.96 kg/m?Marland Kitchen     Past Medical History  Patient Active Problem List    Diagnosis Date Noted   ? Personal history of malignant neoplasm of skin 11/19/2018   ? Unspecified fall, initial encounter 11/19/2018   ? Elevated coronary artery calcium score 01/02/2018     Total calcium 217.5  Lt Main 17.3, LAD 108.7, LCX 6.7, RCA 84.7,      ? 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 Osteopenia 12/02/2013   ? 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 Vertigo 03/14/2011   ? 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 + leak and poor tone, no prolapse on exam 02/2011.   UDS (03/2011) demonstrating large volume incontinence from DO and LPP, emptied completely. Hx of supracervical hysterectomy  Did not like drying effects of toviaz; continues on estrace.  Very pleased with improvements in SUI and UUI on PFRT and wishes to hold mirabegron       ? Rosacea 04/03/2006   ? Hypothyroid    ? Migraine      better     ? Essential hypertension    ? GERD (gastroesophageal reflux disease)    ? Hyperlipidemia    ? Depression          Review of Systems   Constitution: Negative.   HENT: Negative.    Eyes: Negative.    Cardiovascular: Negative.    Respiratory: Negative.    Endocrine: Negative.    Hematologic/Lymphatic: Negative.    Skin: Negative.    Musculoskeletal: Positive for arthritis.   Gastrointestinal: Negative.    Genitourinary: Positive for bladder incontinence.   Neurological: Negative.    Psychiatric/Behavioral: Negative.    Allergic/Immunologic: Positive for environmental allergies.       Physical Exam  Comfortable at rest  No distress  Limited by telehealth  Cardiovascular Studies  As above    Assessment and Plan  Encounter Diagnoses   Name Primary?   ? Essential hypertension Yes   ? Mixed hyperlipidemia    ? Elevated coronary artery calcium score             Ms Regino has a history of hypertension hyperlipidemia and elevated calcium score/coronary artery disease.  She has been making some positive lifestyle changes but is not at goal with regards to her lipid control.    Her LDL has reached goal unfortunately her triglycerides are still elevated however we have not repeated her lipids since she has been taking Vascepa.  She will continue with positive diet and lifestyle changes and we will repeat her lipids in 3 months.    I appreciate the opportunity of seeing Ms. Salvas via telehealth today.  We will contact her after we have reviewed the results of her updated lipid profile and plan to see her back in follow-up in 6 months.           Current Medications (including today's revisions)  ? amitriptyline (ELAVIL) 25 mg tablet Take one tablet by mouth at bedtime daily.   ? amLODIPine (NORVASC) 10 mg tablet Take one tablet by mouth daily.   ? atorvastatin (LIPITOR) 80 mg tablet Take one tablet by mouth daily.   ? CALCIUM CARBONATE/VITAMIN D3 (CALCIUM + D PO) Take 1 tablet by mouth daily.   ? celecoxib (CELEBREX) 200 mg capsule Take one capsule by mouth daily.   ? dicyclomine (BENTYL) 10 mg capsule Take one capsule by mouth twice daily.   ? duloxetine DR (CYMBALTA) 60 mg capsule Take one capsule by mouth daily.   ? esomeprazole DR(+) (NEXIUM) 40 mg capsule Take one capsule by mouth twice daily. Take on an empty stomach at least 1 hour before or 2 hours after food.   ? estradiol (ESTRACE) 0.5 mg tablet Take one tablet by mouth daily.   ? famotidine (PEPCID) 40 mg tablet Take one tablet by mouth daily.   ? fluticasone propionate (FLONASE) 50 mcg/actuation nasal spray, suspension Apply two sprays to each nostril as directed daily. Shake bottle gently before using.   ? icosapent ethyL (VASCEPA) 1 gram capsule Take two capsules by mouth twice daily with meals.   ? levothyroxine (SYNTHROID) 150 mcg tablet TAKE ONE TABLET BY MOUTH DAILY 30 MINUTES BEFORE BREAKFAST.   ? lidocaine viscous 2 % solution Take 15 mL by mouth every 3-4 hours as needed.   ? lisinopril (ZESTRIL) 40 mg tablet Take one tablet by mouth daily.   ? methocarbamol (ROBAXIN) 500 mg tablet Take one tablet to two tablets by mouth four times daily as needed for Spasms.   ? mirabegron(+) ER [facility] ER) 25 mg tablet Take one tablet by mouth daily.   ? MULTIVITAMIN PO Take 1 tablet by mouth daily.           Telehealth 10 -15 mins

## 2018-12-06 NOTE — Telephone Encounter
Called patient and reviewed instructions per AVS.  [place] and will plan to have repeat labs in Feb. Lab orders mailed to patient with AVS

## 2018-12-07 ENCOUNTER — Encounter: Admit: 2018-12-07 | Discharge: 2018-12-07 | Payer: MEDICARE

## 2018-12-07 DIAGNOSIS — E785 Hyperlipidemia, unspecified: Secondary | ICD-10-CM

## 2018-12-07 DIAGNOSIS — K219 Gastro-esophageal reflux disease without esophagitis: Secondary | ICD-10-CM

## 2018-12-07 DIAGNOSIS — G43909 Migraine, unspecified, not intractable, without status migrainosus: Secondary | ICD-10-CM

## 2018-12-07 DIAGNOSIS — M1990 Unspecified osteoarthritis, unspecified site: Secondary | ICD-10-CM

## 2018-12-07 DIAGNOSIS — J302 Other seasonal allergic rhinitis: Secondary | ICD-10-CM

## 2018-12-07 DIAGNOSIS — N390 Urinary tract infection, site not specified: Secondary | ICD-10-CM

## 2018-12-07 DIAGNOSIS — E039 Hypothyroidism, unspecified: Secondary | ICD-10-CM

## 2018-12-07 DIAGNOSIS — K289 Gastrojejunal ulcer, unspecified as acute or chronic, without hemorrhage or perforation: Secondary | ICD-10-CM

## 2018-12-07 DIAGNOSIS — F329 Major depressive disorder, single episode, unspecified: Secondary | ICD-10-CM

## 2018-12-07 DIAGNOSIS — R42 Dizziness and giddiness: Secondary | ICD-10-CM

## 2018-12-09 ENCOUNTER — Encounter: Admit: 2018-12-09 | Discharge: 2018-12-09 | Payer: MEDICARE

## 2018-12-09 DIAGNOSIS — G43909 Migraine, unspecified, not intractable, without status migrainosus: Secondary | ICD-10-CM

## 2018-12-09 DIAGNOSIS — N390 Urinary tract infection, site not specified: Secondary | ICD-10-CM

## 2018-12-09 DIAGNOSIS — J302 Other seasonal allergic rhinitis: Secondary | ICD-10-CM

## 2018-12-09 DIAGNOSIS — E785 Hyperlipidemia, unspecified: Secondary | ICD-10-CM

## 2018-12-09 DIAGNOSIS — K289 Gastrojejunal ulcer, unspecified as acute or chronic, without hemorrhage or perforation: Secondary | ICD-10-CM

## 2018-12-09 DIAGNOSIS — E039 Hypothyroidism, unspecified: Secondary | ICD-10-CM

## 2018-12-09 DIAGNOSIS — R42 Dizziness and giddiness: Secondary | ICD-10-CM

## 2018-12-09 DIAGNOSIS — F329 Major depressive disorder, single episode, unspecified: Secondary | ICD-10-CM

## 2018-12-09 DIAGNOSIS — M1990 Unspecified osteoarthritis, unspecified site: Secondary | ICD-10-CM

## 2018-12-09 DIAGNOSIS — K219 Gastro-esophageal reflux disease without esophagitis: Secondary | ICD-10-CM

## 2019-01-05 ENCOUNTER — Encounter: Admit: 2019-01-05 | Discharge: 2019-01-05 | Payer: MEDICARE

## 2019-01-05 MED ORDER — DICYCLOMINE 10 MG PO CAP
10 mg | ORAL_CAPSULE | Freq: Two times a day (BID) | ORAL | 3 refills | Status: AC
Start: 2019-01-05 — End: ?

## 2019-01-05 MED ORDER — ESTRADIOL 0.5 MG PO TAB
0.5 mg | ORAL_TABLET | Freq: Every day | ORAL | 3 refills | 30.00000 days | Status: AC
Start: 2019-01-05 — End: ?

## 2019-01-05 MED ORDER — LISINOPRIL 40 MG PO TAB
40 mg | ORAL_TABLET | Freq: Every day | ORAL | 3 refills | Status: AC
Start: 2019-01-05 — End: ?

## 2019-01-05 MED ORDER — AMITRIPTYLINE 25 MG PO TAB
25 mg | ORAL_TABLET | Freq: Every evening | ORAL | 3 refills | Status: AC
Start: 2019-01-05 — End: ?

## 2019-01-05 MED ORDER — AMLODIPINE 10 MG PO TAB
10 mg | ORAL_TABLET | Freq: Every day | ORAL | 3 refills | Status: AC
Start: 2019-01-05 — End: ?

## 2019-01-20 ENCOUNTER — Encounter: Admit: 2019-01-20 | Discharge: 2019-01-20 | Payer: MEDICARE

## 2019-01-20 MED ORDER — METHOCARBAMOL 500 MG PO TAB
500-1000 mg | ORAL_TABLET | Freq: Four times a day (QID) | ORAL | 11 refills | Status: AC | PRN
Start: 2019-01-20 — End: ?

## 2019-01-20 NOTE — Telephone Encounter
LOV 11/19/18 WRIST PAIN  LAST REFILLED 12/27/17 #100 +11

## 2019-01-23 ENCOUNTER — Encounter: Admit: 2019-01-23 | Discharge: 2019-01-23 | Payer: MEDICARE

## 2019-01-23 MED ORDER — DULOXETINE 60 MG PO CPDR
60 mg | ORAL_CAPSULE | Freq: Every day | ORAL | 3 refills | 60.00000 days | Status: AC
Start: 2019-01-23 — End: ?

## 2019-01-23 NOTE — Telephone Encounter
LOV 11/19/18 WRIST PAIN  LF 12/17/17 #90+3

## 2019-02-03 ENCOUNTER — Encounter: Admit: 2019-02-03 | Discharge: 2019-02-03 | Payer: MEDICARE

## 2019-02-03 DIAGNOSIS — N3941 Urge incontinence: Secondary | ICD-10-CM

## 2019-02-03 MED ORDER — MYRBETRIQ 25 MG PO TB24
ORAL_TABLET | Freq: Every day | 3 refills | Status: AC
Start: 2019-02-03 — End: ?

## 2019-02-03 NOTE — Telephone Encounter
LOV 11/19/18 wrist pain  LF 12/17/17 #90+3

## 2019-02-05 ENCOUNTER — Encounter: Admit: 2019-02-05 | Discharge: 2019-02-05 | Payer: MEDICARE

## 2019-02-05 MED ORDER — FLUTICASONE PROPIONATE 50 MCG/ACTUATION NA SPSN
Freq: Every day | NASAL | 3 refills | 60.00000 days | Status: AC
Start: 2019-02-05 — End: ?

## 2019-02-05 NOTE — Telephone Encounter
LOV 11/19/18 wrist pain   LF 11/19/18 16g+3

## 2019-02-08 ENCOUNTER — Encounter: Admit: 2019-02-08 | Discharge: 2019-02-08 | Payer: MEDICARE

## 2019-02-10 MED ORDER — VASCEPA 1 GRAM PO CAP
ORAL_CAPSULE | Freq: Every day | ORAL | 0 refills | 30.00000 days | Status: DC
Start: 2019-02-10 — End: 2019-05-12

## 2019-02-10 MED ORDER — ATORVASTATIN 80 MG PO TAB
80 mg | ORAL_TABLET | Freq: Every day | ORAL | 0 refills | 90.00000 days | Status: DC
Start: 2019-02-10 — End: 2019-05-12

## 2019-03-26 ENCOUNTER — Encounter: Admit: 2019-03-26 | Discharge: 2019-03-26 | Payer: MEDICARE

## 2019-04-01 ENCOUNTER — Encounter: Admit: 2019-04-01 | Discharge: 2019-04-01 | Payer: MEDICARE

## 2019-04-01 DIAGNOSIS — E039 Hypothyroidism, unspecified: Secondary | ICD-10-CM

## 2019-04-01 MED ORDER — LEVOTHYROXINE 150 MCG PO TAB
150 ug | ORAL_TABLET | Freq: Every day | ORAL | 0 refills | 30.00000 days | Status: DC
Start: 2019-04-01 — End: 2019-06-27

## 2019-04-01 NOTE — Telephone Encounter
LOV 11/19/18 wrist pain  LF 09/27/18 #90

## 2019-04-08 ENCOUNTER — Ambulatory Visit: Admit: 2019-04-08 | Discharge: 2019-04-08 | Payer: MEDICARE

## 2019-04-08 ENCOUNTER — Encounter: Admit: 2019-04-08 | Discharge: 2019-04-08 | Payer: MEDICARE

## 2019-04-08 DIAGNOSIS — R931 Abnormal findings on diagnostic imaging of heart and coronary circulation: Secondary | ICD-10-CM

## 2019-04-08 DIAGNOSIS — I10 Essential (primary) hypertension: Secondary | ICD-10-CM

## 2019-04-08 DIAGNOSIS — E782 Mixed hyperlipidemia: Secondary | ICD-10-CM

## 2019-04-08 LAB — LIPID PROFILE
Lab: 107 mg/dL (ref 0.4–1.00)
Lab: 149 mg/dL (ref <200)
Lab: 256 mg/dL — ABNORMAL HIGH (ref <150)
Lab: 42 mg/dL (ref >40)
Lab: 51 mg/dL (ref 7–25)
Lab: 70 mg/dL (ref <100)

## 2019-04-08 LAB — BASIC METABOLIC PANEL
Lab: 138 MMOL/L (ref 137–147)
Lab: 4.1 MMOL/L (ref 3.5–5.1)
Lab: 9.7 mg/dL (ref 8.5–10.6)
Lab: >60 mL/min (ref >60)
Lab: >60 mL/min (ref >60)

## 2019-04-24 ENCOUNTER — Encounter: Admit: 2019-04-24 | Discharge: 2019-04-24 | Payer: MEDICARE

## 2019-04-24 NOTE — Telephone Encounter
Spoke with patient - she does use My Chart - will attach a picture and send it to you.

## 2019-04-24 NOTE — Telephone Encounter
Does she use My chart ? If so can she attach a picture of the toe?

## 2019-04-24 NOTE — Telephone Encounter
States she has a problem with her right great toenail - she has some type of fungus under her nail - gets really dark then falls off - takes about 6 months to regrow then the whole process repeats itself - do you need to see her or does she need a referral to a podiatrist?

## 2019-05-06 ENCOUNTER — Encounter: Admit: 2019-05-06 | Discharge: 2019-05-06 | Payer: MEDICARE

## 2019-05-06 ENCOUNTER — Ambulatory Visit: Admit: 2019-05-06 | Discharge: 2019-05-07 | Payer: MEDICARE

## 2019-05-06 DIAGNOSIS — N390 Urinary tract infection, site not specified: Secondary | ICD-10-CM

## 2019-05-06 DIAGNOSIS — E039 Hypothyroidism, unspecified: Secondary | ICD-10-CM

## 2019-05-06 DIAGNOSIS — R42 Dizziness and giddiness: Secondary | ICD-10-CM

## 2019-05-06 DIAGNOSIS — G43909 Migraine, unspecified, not intractable, without status migrainosus: Secondary | ICD-10-CM

## 2019-05-06 DIAGNOSIS — F329 Major depressive disorder, single episode, unspecified: Secondary | ICD-10-CM

## 2019-05-06 DIAGNOSIS — R931 Abnormal findings on diagnostic imaging of heart and coronary circulation: Secondary | ICD-10-CM

## 2019-05-06 DIAGNOSIS — K219 Gastro-esophageal reflux disease without esophagitis: Secondary | ICD-10-CM

## 2019-05-06 DIAGNOSIS — M1990 Unspecified osteoarthritis, unspecified site: Secondary | ICD-10-CM

## 2019-05-06 DIAGNOSIS — K289 Gastrojejunal ulcer, unspecified as acute or chronic, without hemorrhage or perforation: Secondary | ICD-10-CM

## 2019-05-06 DIAGNOSIS — E785 Hyperlipidemia, unspecified: Secondary | ICD-10-CM

## 2019-05-06 DIAGNOSIS — J302 Other seasonal allergic rhinitis: Secondary | ICD-10-CM

## 2019-05-06 NOTE — Telephone Encounter
Left voicemail for patient to return my call regarding any questions or concerns from today's Telehealth after visit summary. My direct phone number left on voicemail for return call.

## 2019-05-06 NOTE — Progress Notes
Obtained patient's verbal consent to treat them and their agreement to [facility] financial policy and NPP via this telehealth visit during the [facility]    Date of Service: 05/06/2019    Tonya Williamson is a 74 y.o. female.       HPI     74 year old female with a history of hypertension hyperlipidemia hypothyroidism and coronary artery disease by elevated calcium score of 217 returns for follow-up visit via telehealth.  ?  Her latest lipid profile shows that she has not made much improvement in her triglycerides.  She states there is room in her diet for further changes.      Historically she underwent a regadenoson thallium stress test which showed an ejection fraction of 79% and no evidence of myocardial ischemia.  ?  An echocardiogram revealed EF of 60 to 65% with mild tricuspid regurgitation. ?Estimated peak systolic PA pressure was 28 mmHg  ?  She is not diabetic.  She is on Vascepa.             Past Medical History  Patient Active Problem List    Diagnosis Date Noted   ? Personal history of malignant neoplasm of skin 11/19/2018   ? Unspecified fall, initial encounter 11/19/2018   ? Elevated coronary artery calcium score 01/02/2018     Total calcium 217.5  Lt Main 17.3, LAD 108.7, LCX 6.7, RCA 84.7,      ? 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 Osteopenia 12/02/2013   ? 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 Vertigo 03/14/2011   ? 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 + leak and poor tone, no prolapse on exam 02/2011.   UDS (03/2011) demonstrating large volume incontinence from DO and LPP, emptied completely. Hx of supracervical hysterectomy  Did not like drying effects of toviaz; continues on estrace.  Very pleased with improvements in SUI and UUI on PFRT and wishes to hold mirabegron       ? Rosacea 04/03/2006   ? Hypothyroid    ? Migraine better     ? Essential hypertension    ? GERD (gastroesophageal reflux disease)    ? Hyperlipidemia    ? Depression      Telehealth Patient Reported Vitals     Row Name 05/06/19 1517                BP:  ? BP cuff is not accurate and doesn't like to use         Weight:  70.8 kg (156 lb)        Height:  1.651 m (5' 5)        Pain Score:  Zero        Pain Location:  CHEST                  Review of Systems   Constitution: Negative.   HENT: Negative.    Eyes: Negative.    Cardiovascular: Negative.    Respiratory: Negative.    Endocrine: Negative.    Skin: Negative.    Musculoskeletal: Negative.    Gastrointestinal: Negative.    Genitourinary: Negative.    Neurological: Negative.    Psychiatric/Behavioral: Negative.    Allergic/Immunologic: Negative.        Physical Exam  Comfortable at rest  No distress  Limited by telehealth  Cardiovascular Studies  As above    Assessment and Plan  Encounter Diagnoses   Name Primary?   ? Elevated coronary artery calcium score Yes   ? Essential hypertension    ? Mixed hyperlipidemia             Tonya Williamson?has a history of hypertension hyperlipidemia and elevated calcium score/coronary artery disease. ?    There is still room for improvement in her diet and lifestyle changes.  She states she has somewhat of a sweet tooth although on initial questioning she did not seem to think this has been too much of a problem.    She is taking 4 g of Vascepa a day.    I will ask her to see our nurse practitioner Talbert Forest for additional input into prevention.      I appreciate the opportunity of seeing Tonya Williamson via telehealth today.  She will return for follow-up with me in 6 months.         Current Medications (including today's revisions)  ? amitriptyline (ELAVIL) 25 mg tablet TAKE ONE TABLET BY MOUTH AT BEDTIME DAILY.   ? amLODIPine (NORVASC) 10 mg tablet TAKE ONE TABLET BY MOUTH DAILY.   ? atorvastatin (LIPITOR) 80 mg tablet TAKE ONE TABLET BY MOUTH DAILY.   ? CALCIUM CARBONATE/VITAMIN D3 (CALCIUM + D PO) Take 1 tablet by mouth daily.   ? celecoxib (CELEBREX) 200 mg capsule Take one capsule by mouth daily.   ? dicyclomine (BENTYL) 10 mg capsule TAKE ONE CAPSULE BY MOUTH TWICE DAILY.   ? duloxetine DR (CYMBALTA) 60 mg capsule TAKE ONE CAPSULE BY MOUTH DAILY.   ? esomeprazole DR(+) (NEXIUM) 40 mg capsule Take one capsule by mouth twice daily. Take on an empty stomach at least 1 hour before or 2 hours after food.   ? estradioL (ESTRACE) 0.5 mg tablet TAKE ONE TABLET BY MOUTH DAILY.   ? famotidine (PEPCID) 40 mg tablet Take one tablet by mouth daily.   ? fluticasone propionate (FLONASE) 50 mcg/actuation nasal spray, suspension USE 2 SPRAYS in each nostril AS DIRECTED DAILY. shake BEFORE USE   ? levothyroxine (SYNTHROID) 150 mcg tablet TAKE ONE TABLET BY MOUTH DAILY 30 MINUTES BEFORE BREAKFAST.   ? lidocaine viscous 2 % solution Take 15 mL by mouth every 3-4 hours as needed.   ? lisinopriL (ZESTRIL) 40 mg tablet TAKE ONE TABLET BY MOUTH DAILY.   ? methocarbamoL (ROBAXIN) 500 mg tablet TAKE ONE TABLET TO TWO TABLETS BY MOUTH FOUR TIMES DAILY AS NEEDED FOR SPASMS.   ? MULTIVITAMIN PO Take 1 tablet by mouth daily.   ? MYRBETRIQ 25 mg tablet TAKE 1 TABLET DAILY   ? VASCEPA 1 gram capsule TAKE TWO CAPSULES BY MOUTH TWICEDAILY WITH MEALS.           Telehealth 20 to 25 minutes including documentation record review and counseling

## 2019-05-07 DIAGNOSIS — E782 Mixed hyperlipidemia: Secondary | ICD-10-CM

## 2019-05-07 DIAGNOSIS — I10 Essential (primary) hypertension: Secondary | ICD-10-CM

## 2019-05-11 ENCOUNTER — Encounter: Admit: 2019-05-11 | Discharge: 2019-05-11 | Payer: MEDICARE

## 2019-05-12 MED ORDER — ATORVASTATIN 80 MG PO TAB
ORAL_TABLET | Freq: Every day | ORAL | 3 refills | 90.00000 days | Status: DC
Start: 2019-05-12 — End: 2019-08-06

## 2019-05-12 MED ORDER — VASCEPA 1 GRAM PO CAP
ORAL_CAPSULE | Freq: Two times a day (BID) | ORAL | 3 refills | 30.00000 days | Status: AC
Start: 2019-05-12 — End: ?

## 2019-05-26 IMAGING — CR CHEST
1 series · 1 of 1 positions shown · non-contrast
Comparison: none

[chest port x-wise]
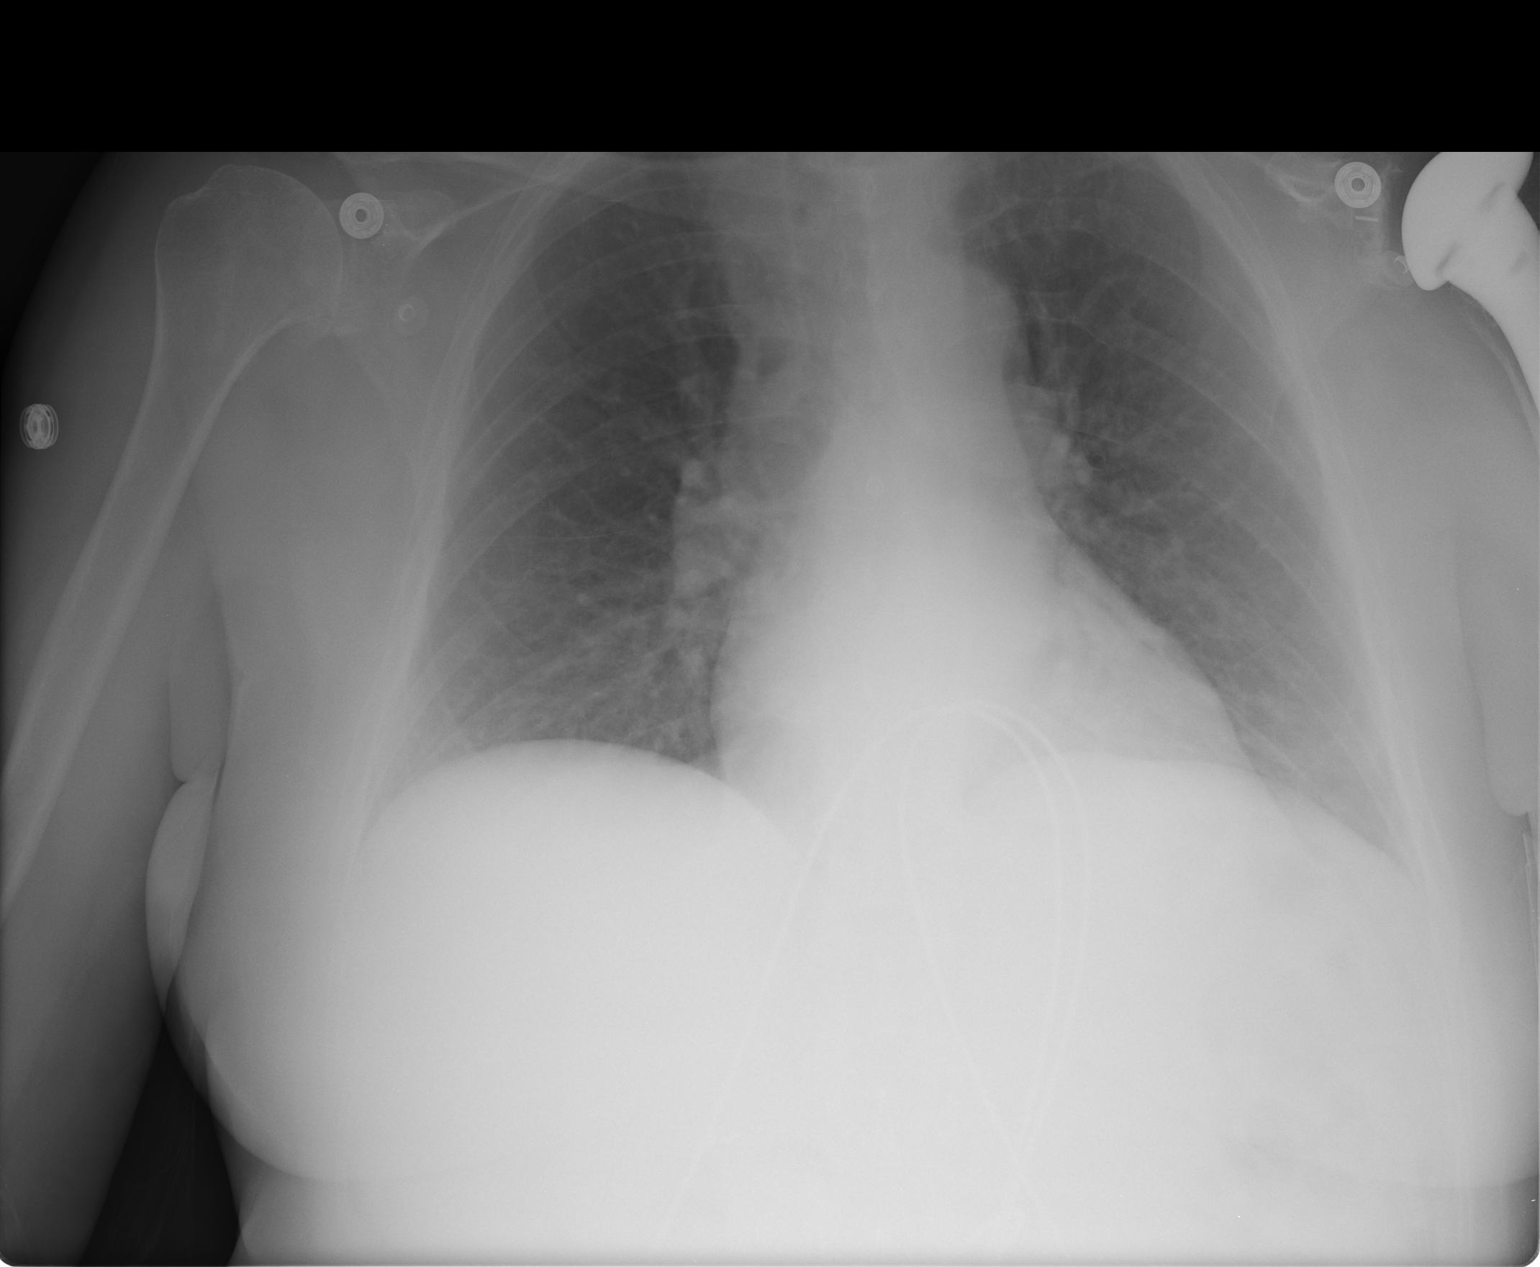

[1 of 1 positions shown; findings below may reference images not displayed]

EXAM

RADIOLOGICAL EXAMINATION, CHEST; 1 VIEW FRONTAL CPT 68888

INDICATION

chest pain
C/O STERNAL CHEST PAIN THAT RADIATES TO BACK AND JAW. DIZZINESS WHEN CHANGING POSITIONS. DENIES
HEART OR LUNG CONDTIONS, H/O LT SHOULDER ARTHROPLASTY. HB

TECHNIQUE

1 view of the chest was acquired.

COMPARISONS

None

FINDINGS

The cardiomediastinal silhouette is not enlarged on this rotated AP portable film..

No failure or effusion is identified. There is no dense consolidation

Previous surgical changes to the left shoulder noted.

IMPRESSION

I see no evidence of failure or consolidative change.

Tech Notes:

C/O STERNAL CHEST PAIN THAT RADIATES TO BACK AND JAW. DIZZINESS WHEN CHANGING POSITIONS. DENIES
HEART OR LUNG CONDTIONS, H/O LT SHOULDER ARTHROPLASTY. HB

## 2019-06-01 ENCOUNTER — Encounter: Admit: 2019-06-01 | Discharge: 2019-06-01 | Payer: MEDICARE

## 2019-06-01 MED ORDER — ESOMEPRAZOLE MAGNESIUM 40 MG PO CPDR
ORAL_CAPSULE | Freq: Two times a day (BID) | 1 refills | 90.00000 days | Status: AC
Start: 2019-06-01 — End: ?

## 2019-06-25 ENCOUNTER — Encounter: Admit: 2019-06-25 | Discharge: 2019-06-25 | Payer: MEDICARE

## 2019-06-25 MED ORDER — BENZONATATE 100 MG PO CAP
200 mg | ORAL_CAPSULE | ORAL | 1 refills | 9.00000 days | Status: AC | PRN
Start: 2019-06-25 — End: ?

## 2019-06-25 MED ORDER — AMOXICILLIN-POT CLAVULANATE 875-125 MG PO TAB
1 | ORAL_TABLET | Freq: Two times a day (BID) | ORAL | 0 refills | 7.00000 days | Status: AC
Start: 2019-06-25 — End: ?

## 2019-06-25 NOTE — Telephone Encounter
Spoke with patient - states sxs started with a headache - pressure around her eyes last week - now with thick green nasal secretions  - coughing some -  - no fever - would like to know if Dr. Reita Cliche could send a rx for an antibiotic and cough med - tessalon perles to her [facility] in [place].

## 2019-06-25 NOTE — Telephone Encounter
Spoke with patient - does not need covid testing - had her vaccines - last was in March - will bring in card at next visit - received the moderna vaccines. Dr Reita Cliche said we can send a rx for augmentin and tessalon perles to your pharmacy.  Rx's sent.

## 2019-06-25 NOTE — Telephone Encounter
Does she need covid testing or had her shots? If had her shots- dates and which one for her chart?  Ok on augmentin 875 mg #20 and tessalon perles 200 mg #30- 1 rf

## 2019-06-27 ENCOUNTER — Encounter: Admit: 2019-06-27 | Discharge: 2019-06-27 | Payer: MEDICARE

## 2019-06-27 DIAGNOSIS — E039 Hypothyroidism, unspecified: Secondary | ICD-10-CM

## 2019-06-27 MED ORDER — LEVOTHYROXINE 150 MCG PO TAB
150 ug | ORAL_TABLET | Freq: Every day | ORAL | 5 refills | 30.00000 days | Status: AC
Start: 2019-06-27 — End: ?

## 2019-06-27 NOTE — Telephone Encounter
LOV 11/19/18 wrist pain   LS 04/01/19 #90

## 2019-08-06 ENCOUNTER — Encounter: Admit: 2019-08-06 | Discharge: 2019-08-06 | Payer: MEDICARE

## 2019-08-06 ENCOUNTER — Ambulatory Visit: Admit: 2019-08-06 | Discharge: 2019-08-07 | Payer: MEDICARE

## 2019-08-06 DIAGNOSIS — E782 Mixed hyperlipidemia: Secondary | ICD-10-CM

## 2019-08-06 DIAGNOSIS — K289 Gastrojejunal ulcer, unspecified as acute or chronic, without hemorrhage or perforation: Secondary | ICD-10-CM

## 2019-08-06 DIAGNOSIS — R931 Abnormal findings on diagnostic imaging of heart and coronary circulation: Secondary | ICD-10-CM

## 2019-08-06 DIAGNOSIS — M1990 Unspecified osteoarthritis, unspecified site: Secondary | ICD-10-CM

## 2019-08-06 DIAGNOSIS — E559 Vitamin D deficiency, unspecified: Secondary | ICD-10-CM

## 2019-08-06 DIAGNOSIS — R42 Dizziness and giddiness: Secondary | ICD-10-CM

## 2019-08-06 DIAGNOSIS — K219 Gastro-esophageal reflux disease without esophagitis: Secondary | ICD-10-CM

## 2019-08-06 DIAGNOSIS — F329 Major depressive disorder, single episode, unspecified: Secondary | ICD-10-CM

## 2019-08-06 DIAGNOSIS — N390 Urinary tract infection, site not specified: Secondary | ICD-10-CM

## 2019-08-06 DIAGNOSIS — J302 Other seasonal allergic rhinitis: Secondary | ICD-10-CM

## 2019-08-06 DIAGNOSIS — R7303 Prediabetes: Secondary | ICD-10-CM

## 2019-08-06 DIAGNOSIS — I10 Essential (primary) hypertension: Secondary | ICD-10-CM

## 2019-08-06 DIAGNOSIS — G43909 Migraine, unspecified, not intractable, without status migrainosus: Secondary | ICD-10-CM

## 2019-08-06 DIAGNOSIS — E039 Hypothyroidism, unspecified: Secondary | ICD-10-CM

## 2019-08-06 DIAGNOSIS — E785 Hyperlipidemia, unspecified: Secondary | ICD-10-CM

## 2019-08-06 MED ORDER — ATORVASTATIN 80 MG PO TAB
ORAL_TABLET | Freq: Every evening | 3 refills | Status: AC
Start: 2019-08-06 — End: ?

## 2019-08-06 MED ORDER — EZETIMIBE 10 MG PO TAB
10 mg | ORAL_TABLET | Freq: Every day | ORAL | 11 refills | Status: AC
Start: 2019-08-06 — End: ?

## 2019-08-06 NOTE — Patient Instructions
It was very nice meeting you today, the following is what we discussed.      Decrease atorvastatin 40mg  every evening    Start zetia every day    Check lab in September prior to appointment with Dr. Gwen Pounds    Look at whole 30, and paleo recipes as well as mediterranean diet    Name: Tonya Williamson  DOB: [birth year]  Age & Gender: 74 y.o. female  Visit Date: 08/06/2019    Lipid Profile Results     Cholesterol   Date Value Ref Range Status   04/08/2019 149 <200 MG/DL Final   45/40/9811 914 <200 MG/DL Final   78/29/5621 308 <200 MG/DL Final   65/78/4696 295 <200 MG/DL Final   28/41/3244 010 <200 MG/DL Final   27/25/3664 403 <200 MG/DL Final   ]  Goal: Total Cholesterol < 190    HDL   Date Value Ref Range Status   04/08/2019 42 >40 MG/DL Final   47/42/5956 44 >38 MG/DL Final   75/64/3329 41 >51 MG/DL Final   88/41/6606 46 >30 MG/DL Final   16/02/930 45 >35 MG/DL Final   57/32/2025 45 >42 MG/DL Final     High Density Lipoprotein or Good Cholesterol   Goal: Greater than 45 for men  Goal: Greater than 55 for women  To Raise HDL    Regular exercise  Quit Smoking  Improve nutritional habits  Bio identical Estrogen replacement (If applicable) Increase Omega 3 fats in diet  Nuts (walnuts, almonds, pecans, 1/4 cup daily)  Oil (Extra virgin olive oil, avocado)  Seeds (pumpkin, flax, chia) (Monounsaturated fats)  Eliminate hydrogenated fats        LDL   Date Value Ref Range Status   04/08/2019 70 <100 mg/dL Final   70/62/3762 69 <831 mg/dL Final   51/76/1607 73 <371 mg/dL Final   08/09/9483 90 <462 MG/DL Final   70/35/0093 91 <818 MG/DL Final   29/93/7169 97 <678 MG/DL Final     Low Density Lipoprotein or Bad Cholesterol  Goal: Less than 70 in patients with CAD or CAD equivalent  Goal: Less than 100 for primary prevention patients     To Lower LDL    Limit saturated fats (cheese, butter, fatty meats)  Limit hydrogenated fat (packaged food)   Exercise  More vegetables  Berries        Triglycerides   Date Value Ref Range Status   04/08/2019 256 (H) <150 MG/DL Final   93/81/0175 102 (H) <150 MG/DL Final   58/52/7782 423 (H) <150 MG/DL Final   53/61/4431 540 (H) <150 MG/DL Final   08/67/6195 093 (H) <150 MG/DL Final   26/71/2458 099 (H) <150 MG/DL Final     Triglycerides less than 150  To Lower Triglycerides    Limit sugar  Limit starches (pastas, bagels, bread, crackers  & foods made with white or wheat flour)  Balanced meals (protein and color)  Omega 3 (2,000 mg daily)  EPA, DHA   Regular exercise  Limit saturated and hydrogenated fats  Increase Omega 3 fats in diet  (nuts, avocado, olive oil)  No more than 1 or 2 alcoholic drinks per day          Blood Sugar     Hemoglobin A1C   Date Value Ref Range Status   05/04/2017 6.2 (H) 4.0 - 6.0 % Final     Comment:     The ADA recommends that most patients with type 1 and type 2 diabetes  maintain   an A1c level <7%.     01/23/2012 5.9 4.0 - 6.0 % Final     Comment:     NOTE NEW REFERENCE RANGES  For patients with Type I or Type II Diabetes Mellitus, the ADA recommends   maintaining the A1c level <7%.   06/16/2011 5.9 4.0 - 6.0 % Final     Comment:     NOTE NEW REFERENCE RANGES  For patients with Type I or Type II Diabetes Mellitus, the ADA recommends   maintaining the A1c level <7%.     HgbA1C (Average blood sugar over previous 3 to 4 months  4.4 - 5.6 Normal  5.7 - 6.4 Pre-Diabetes     >= 6.5 Diabetes    Glucose   Date Value Ref Range Status   04/08/2019 99 70 - 100 MG/DL Final   16/11/9602 93 70 - 100 MG/DL Final   54/10/8117 97 70 - 100 MG/DL Final   14/78/2956 88 70 - 100 MG/DL Final   21/30/8657 846 (H) 70 - 100 MG/DL Final   96/29/5284 95 70 - 100 MG/DL Final   13/24/4010 93 70 - 100 MG/DL Final   27/25/3664 90 70 - 110 MG/DL Final   40/34/7425 77 70 - 110 MG/DL Final   95/63/8756 79 70 - 110 MG/DL Final     Glucose Fasting   Date Value Ref Range Status   06/13/2011 100 70 - 100 MG/DL Final     Less than 433: normal  100 - 125: Pre-Diabetes  126+ : Diabetes      To Lower Blood Sugar Limit sugar  Limit starches (pastas, bagels, bread, crackers, and foods made with flower) Lose weight  Exercise  No more than 1 or 2 alcoholic drinks per day          Liver Enzymes     Alanine Aminotransferase  ALT (SGPT)   Date Value Ref Range Status   04/19/2018 18 7 - 56 U/L Final   06/15/2017 28 7 - 56 U/L Final   05/04/2017 23 7 - 56 U/L Final   06/09/2016 14 7 - 56 U/L Final   12/15/2014 23 7 - 56 U/L Final   08/07/2014 19 7 - 56 U/L Final       Aspartate Aminotransferase  AST (SGOT)   Date Value Ref Range Status   04/19/2018 19 7 - 40 U/L Final   06/15/2017 20 7 - 40 U/L Final   05/04/2017 20 7 - 40 U/L Final   06/09/2016 19 7 - 40 U/L Final   12/15/2014 23 7 - 40 U/L Final   08/07/2014 19 7 - 40 U/L Final         Vitamin D   Vitamin D 25-Hydroxy  Vitamin D(25-OH)Total   Date Value Ref Range Status   06/09/2016 40.6 30 - 80 NG/ML Final   12/20/2012 43.3 30 - 80 NG/ML Final       Goal: 40 - 80    Vitals     Body mass index is 24.9 kg/m?Marland Kitchen  Ht Readings from Last 1 Encounters:   08/06/19 1.664 m (5' 5.51)     Wt Readings from Last 6 Encounters:   08/06/19 68.9 kg (152 lb)   12/06/18 72.7 kg (160 lb 4.4 oz)   12/06/18 70.8 kg (156 lb)   11/19/18 73.5 kg (162 lb)   11/13/18 72.6 kg (160 lb)   10/22/18 72.6 kg (160 lb)       Pulse  Readings from Last 1 Encounters:   11/19/18 77     BP Readings from Last 1 Encounters:   12/06/18 136/74         Numerous studies have now shown that the Mediterranean diet can cause weight loss and help prevent heart attacks, strokes, type 2 diabetes and premature death.  There is no one right way to follow the Mediterranean diet, as there are many countries around the [place] sea and people in different areas may have eaten different foods.  This article describes the dietary pattern typically prescribed in studies that suggest it?s a healthy way of eating.  Consider all of this as a general guideline, not something written in stone. The plan can be adjusted to your individual needs and preferences.    The Basics    ? Eat: Vegetables, berries,  nuts, seeds, legumes,  sweet potatoes, quinoa herbs, spices, fish, seafood and extra virgin olive oil.   ? Eat in moderation: Poultry, eggs, cheese and yogurt. Whole grains  ? Eat only rarely: Red meat.   ? Don't eat: Sugar-sweetened beverages, added sugars, processed meat, refined grains, refined oils and other highly processed foods.   ?   Avoid These Unhealthy Foods    You should avoid these  and ingredients:  ? Added sugar: Soda, candies, ice cream, table sugar and many others.   ? Refined grains: White/wheat bread, pasta made with refined wheat, etc.   ? Trans fats: Found in margarine and various processed foods.   ? Refined oils: Soybean oil cottonseed oil and others.   ? Processed meat: Processed sausages, hot dogs, etc.   ? Highly processed foods: Anything labeled low-fat or diet or which looks like it was made in a factory.    You must read food labels carefully if you want to avoid these unhealthy ingredients.    Foods to Eat    Exactly which foods belong to the Mediterranean diet is controversial, partly because there is such variation between different countries.  The diet examined by most studies is high in healthy plant foods and relatively low in animal foods.  However, eating fish and seafood is recommended at least twice a week.  The Mediterranean lifestyle also involves regular physical activity, sharing meals with other people and enjoying life.  You should base your diet on these healthy, unprocessed Mediterranean foods:    ? Vegetables: Tomatoes, broccoli, kale, spinach, onions, cauliflower, carrots, Brussels sprouts, cucumbers, etc.   ? Fruits: Berries, Apples, oranges, pears, melons, peaches, etc.   ? Nuts and seeds: Almonds, walnuts, macadamia nuts, hazelnuts, cashews, sunflower seeds, pumpkin seeds, etc.   ? Legumes: Beans, peas, lentils, peanuts, chickpeas, etc.   ? Tubers: sweet potatoes, turnips, yams, etc.   ? Whole grains:  Quinoa, Whole steel cut oats, wild rice, rye, barley,   ? Fish and seafood: Salmon, sardines, trout, tuna, mackerel, shrimp, oysters, clams, crab, mussels, etc.   ? Poultry: Chicken, duck, [place], etc.   ? Dairy:  [place] yogurt, [place] yogurt, no added sugar.   ? Herbs and spices: Garlic, basil, mint, rosemary, sage, nutmeg, cinnamon, pepper, etc.   ? Healthy Fats: Extra virgin olive oil, olives, avocados and avocado oil.   ?   Whole, single-ingredient foods are the key to good health.    What to Drink  Water should be your go-to beverage on a Mediterranean diet.  This diet also includes moderate amounts of red wine ? around 1 glass per day.  However, this is  completely optional, and wine should be avoided by anyone with alcoholism or problems controlling their consumption.  Coffee and tea are also completely acceptable, but you should avoid sugar-sweetened beverages and fruit juices, which are very high in sugar.

## 2019-08-06 NOTE — Progress Notes
Telehealth Visit Note    Date of Service: 08/06/2019    Subjective:      Obtained patient's verbal consent to treat them and their agreement to [facility] financial policy and NPP via this telehealth visit during the [facility] Emergency       Tonya Williamson is a 74 y.o. female.    History of Present Illness  She is a pleasant 74 year old female being seen via telehealth for risk factor reduction.  She last saw Dr. Gwen Pounds in March.  Her past medical history includes a CT calcium score of 217, dyslipidemia, vitamin D deficiency, hypertension, and prediabetes.  She remains on 80 mg of atorvastatin and her most recent LDL was 71, triglycerides 784.  She was started on Vascepa approximately 1 year ago.  She had a thallium stress test that was nonischemic with an ejection fraction of 79%.  An echocardiogram showed mild tricuspid regurgitation, EF 60 to 65%.  She has been walking for exercise.  She denies angina, dizziness, dyspnea or lightheadedness.  She does not have any edema, TIA or syncope.  She is trying to improve her nutritional habits and decrease sugar intake.  Her BMI is 24.  She reports her blood pressure has been optimal.    Telehealth Patient Reported Vitals     Row Name 08/06/19 0826                Weight:  68.9 kg (152 lb)        Height:  1.66 m (5' 5.35)        Pain Score:  Zero        Pain Location:  CHEST                 Review of Systems   Constitutional: Negative.    HENT: Negative.    Eyes: Negative.    Respiratory: Negative.    Cardiovascular: Negative.    Gastrointestinal: Negative.    Endocrine: Negative.    Genitourinary: Negative.    Musculoskeletal: Negative.    Allergic/Immunologic: Negative.    Neurological: Negative.    Hematological: Negative.    Psychiatric/Behavioral: Negative.          Objective:         ? amitriptyline (ELAVIL) 25 mg tablet TAKE ONE TABLET BY MOUTH AT BEDTIME DAILY.   ? amLODIPine (NORVASC) 10 mg tablet TAKE ONE TABLET BY MOUTH DAILY.   ? atorvastatin (LIPITOR) 80 mg tablet take 1 tablet BY MOUTH DAILY   ? benzonatate (TESSALON PERLES) 100 mg capsule Take two capsules by mouth every 8 hours as needed for Cough.   ? CALCIUM CARBONATE/VITAMIN D3 (CALCIUM + D PO) Take 1 tablet by mouth daily.   ? celecoxib (CELEBREX) 200 mg capsule Take one capsule by mouth daily.   ? dicyclomine (BENTYL) 10 mg capsule TAKE ONE CAPSULE BY MOUTH TWICE DAILY.   ? duloxetine DR (CYMBALTA) 60 mg capsule TAKE ONE CAPSULE BY MOUTH DAILY.   ? esomeprazole DR (NEXIUM) 40 mg capsule TAKE 1 CAPSULE TWICE DAILY ON AN EMPTY STOMACH AT     LEAST 1 HOUR BEFORE OR 2   HOURS AFTER FOOD   ? estradioL (ESTRACE) 0.5 mg tablet TAKE ONE TABLET BY MOUTH DAILY.   ? ezetimibe (ZETIA) 10 mg tablet Take one tablet by mouth daily.   ? famotidine (PEPCID) 40 mg tablet Take one tablet by mouth daily.   ? fluticasone propionate (FLONASE) 50 mcg/actuation nasal spray, suspension USE 2 SPRAYS in  each nostril AS DIRECTED DAILY. shake BEFORE USE   ? levothyroxine (SYNTHROID) 150 mcg tablet TAKE ONE TABLET BY MOUTH DAILY 30 MINUTES BEFORE BREAKFAST.   ? lidocaine viscous 2 % solution Take 15 mL by mouth every 3-4 hours as needed.   ? lisinopriL (ZESTRIL) 40 mg tablet TAKE ONE TABLET BY MOUTH DAILY.   ? methocarbamoL (ROBAXIN) 500 mg tablet TAKE ONE TABLET TO TWO TABLETS BY MOUTH FOUR TIMES DAILY AS NEEDED FOR SPASMS.   ? MULTIVITAMIN PO Take 1 tablet by mouth daily.   ? MYRBETRIQ 25 mg tablet TAKE 1 TABLET DAILY   ? VASCEPA 1 gram capsule take 2 capsules BY MOUTH TWICE DAILY with meals     Vitals:    08/06/19 0823   Weight: 68.9 kg (152 lb)   Height: 1.664 m (5' 5.51)     Body mass index is 24.9 kg/m?Marland Kitchen     Telehealth Patient Reported Vitals     Row Name 08/06/19 1610                Weight:  68.9 kg (152 lb)        Height:  1.66 m (5' 5.35)        Pain Score:  Zero        Pain Location:  CHEST              Physical Exam  Vitals reviewed.   Constitutional:       Appearance: Normal appearance.   Cardiovascular: Rate and Rhythm: Normal rate.   Pulmonary:      Effort: Pulmonary effort is normal. No respiratory distress.   Skin:     General: Skin is warm and dry.   Neurological:      Mental Status: She is alert and oriented to person, place, and time.   Psychiatric:         Mood and Affect: Mood normal.         Behavior: Behavior is cooperative.         Thought Content: Thought content normal.         Judgment: Judgment normal.              Assessment and Plan:  CT calcium score of 217.  Nonischemic stress test.  Having any cardiac symptoms.  We will continue with Dr. Reduction.  She is scheduled to see Dr. Gwen Pounds in September.    Dyslipidemia.  LDL remains above goal and triglycerides remain elevated despite Vascepa and atorvastatin.  I recommended adding Zetia 10 mg, decreasing atorvastatin to 40 mg and continuing with Vascepa and lifestyle changes.  We will repeat lipid panel in 3 months.  Like to see LDL below 70 and triglycerides below 150.  We discussed the importance of not only limiting sugars but also starches.    Hypertension is optimally controlled no changes are made today.  She remains on amlodipine and lisinopril.  She will continue with her walking regimen and a low-sodium diet.    BMI of 24 puts her ideal weight for her height.    Exercise regimen.  I encouraged her to aim to have 150 minutes of exercise weekly.    Hemoglobin A1c was checked in 2018 that was slightly elevated at 6.2.  We will repeat next visit.  Encouraged her to continue with exercise and being mindful of limiting sugars and starches.    Vitamin D level was checked approximately 3 years ago.  Recommend keeping vitamin D levels between 40 and  60.  We will repeat at next visit.  He has been on a vitamin D supplementation with her calcium.    History of hypothyroidism.  She remains on levothyroxine.  Problem   Pre-Diabetes    A1C 6.3 in 2018                         40 minutes spent on this patient's encounter with counseling and coordination of care taking >50% of the visit.

## 2019-10-30 ENCOUNTER — Encounter: Admit: 2019-10-30 | Discharge: 2019-10-30 | Payer: MEDICARE

## 2019-10-31 ENCOUNTER — Encounter: Admit: 2019-10-31 | Discharge: 2019-10-31 | Payer: MEDICARE

## 2019-10-31 ENCOUNTER — Ambulatory Visit: Admit: 2019-10-31 | Discharge: 2019-10-31 | Payer: MEDICARE

## 2019-10-31 DIAGNOSIS — E559 Vitamin D deficiency, unspecified: Secondary | ICD-10-CM

## 2019-10-31 DIAGNOSIS — E782 Mixed hyperlipidemia: Secondary | ICD-10-CM

## 2019-10-31 DIAGNOSIS — I10 Essential (primary) hypertension: Secondary | ICD-10-CM

## 2019-10-31 DIAGNOSIS — R931 Abnormal findings on diagnostic imaging of heart and coronary circulation: Secondary | ICD-10-CM

## 2019-10-31 DIAGNOSIS — R7303 Prediabetes: Secondary | ICD-10-CM

## 2019-10-31 LAB — LIPID PROFILE
Lab: 106 mg/dL (ref <200)
Lab: 121 mg/dL (ref <150)
Lab: 48 mg/dL (ref <100)
Lab: 62 mg/dL

## 2019-10-31 LAB — GLUCOSE, FASTING: Lab: 97 mg/dL (ref 70–100)

## 2019-10-31 LAB — HEMOGLOBIN A1C: Lab: 5.8 % (ref >40)

## 2019-10-31 LAB — 25-OH VITAMIN D (D2 + D3): Lab: 50 ng/mL (ref 30–80)

## 2019-10-31 LAB — ALT (SGPT): Lab: 19 U/L (ref 7–56)

## 2019-10-31 LAB — AST (SGOT): Lab: 20 U/L (ref 7–40)

## 2019-11-06 ENCOUNTER — Encounter: Admit: 2019-11-06 | Discharge: 2019-11-06 | Payer: MEDICARE

## 2019-11-06 ENCOUNTER — Ambulatory Visit: Admit: 2019-11-06 | Discharge: 2019-11-07 | Payer: MEDICARE

## 2019-11-06 DIAGNOSIS — E782 Mixed hyperlipidemia: Secondary | ICD-10-CM

## 2019-11-06 DIAGNOSIS — G43909 Migraine, unspecified, not intractable, without status migrainosus: Secondary | ICD-10-CM

## 2019-11-06 DIAGNOSIS — K289 Gastrojejunal ulcer, unspecified as acute or chronic, without hemorrhage or perforation: Secondary | ICD-10-CM

## 2019-11-06 DIAGNOSIS — R42 Dizziness and giddiness: Secondary | ICD-10-CM

## 2019-11-06 DIAGNOSIS — E785 Hyperlipidemia, unspecified: Secondary | ICD-10-CM

## 2019-11-06 DIAGNOSIS — I10 Essential (primary) hypertension: Secondary | ICD-10-CM

## 2019-11-06 DIAGNOSIS — R931 Abnormal findings on diagnostic imaging of heart and coronary circulation: Secondary | ICD-10-CM

## 2019-11-06 DIAGNOSIS — N390 Urinary tract infection, site not specified: Secondary | ICD-10-CM

## 2019-11-06 DIAGNOSIS — F329 Major depressive disorder, single episode, unspecified: Secondary | ICD-10-CM

## 2019-11-06 DIAGNOSIS — K219 Gastro-esophageal reflux disease without esophagitis: Secondary | ICD-10-CM

## 2019-11-06 DIAGNOSIS — J302 Other seasonal allergic rhinitis: Secondary | ICD-10-CM

## 2019-11-06 DIAGNOSIS — E039 Hypothyroidism, unspecified: Secondary | ICD-10-CM

## 2019-11-06 DIAGNOSIS — M1990 Unspecified osteoarthritis, unspecified site: Secondary | ICD-10-CM

## 2019-11-06 NOTE — Progress Notes
Telehealth Visit Note    Date of Service: 11/06/2019    Subjective:      Obtained patient's verbal consent to treat them and their agreement to [facility] financial policy and NPP via this telehealth visit during the [facility] Emergency       Tonya Williamson is a 74 y.o. female.    History of Present Illness  74 year old female with a history of hypertension hyperlipidemia hypothyroidism and coronary artery disease by elevated calcium score of 217 returns for follow-up visit?via telehealth.  ?  She has been doing very well with diet weight loss and exercise.  She is involved in water aerobics.  ?  Historically she?underwent a regadenoson thallium stress test which showed an ejection fraction of 79% and no evidence of myocardial ischemia.  ?  An echocardiogram revealed EF of 60 to 65% with mild tricuspid regurgitation. ?Estimated peak systolic PA pressure was 28 mmHg  ?  Her latest lipid profile shows an LDL of 48 and triglycerides of 121.  She is on a statin Zetia and Vascepa.  ?       Review of Systems   Constitutional: Negative.    HENT: Negative.    Eyes: Negative.    Respiratory: Negative.    Cardiovascular: Negative.    Gastrointestinal: Negative.    Endocrine: Negative.    Genitourinary: Negative.    Musculoskeletal: Negative.    Skin: Negative.    Allergic/Immunologic: Negative.    Neurological: Negative.    Hematological: Negative.    Psychiatric/Behavioral: Negative.          Objective:         ? amitriptyline (ELAVIL) 25 mg tablet TAKE ONE TABLET BY MOUTH AT BEDTIME DAILY.   ? amLODIPine (NORVASC) 10 mg tablet TAKE ONE TABLET BY MOUTH DAILY.   ? atorvastatin (LIPITOR) 80 mg tablet Take one half tablet every evening   ? benzonatate (TESSALON PERLES) 100 mg capsule Take two capsules by mouth every 8 hours as needed for Cough.   ? CALCIUM CARBONATE/VITAMIN D3 (CALCIUM + D PO) Take 1 tablet by mouth daily.   ? celecoxib (CELEBREX) 200 mg capsule Take one capsule by mouth daily.   ? dicyclomine (BENTYL) 10 mg capsule TAKE ONE CAPSULE BY MOUTH TWICE DAILY.   ? duloxetine DR (CYMBALTA) 60 mg capsule TAKE ONE CAPSULE BY MOUTH DAILY.   ? esomeprazole DR (NEXIUM) 40 mg capsule TAKE 1 CAPSULE TWICE DAILY ON AN EMPTY STOMACH AT     LEAST 1 HOUR BEFORE OR 2   HOURS AFTER FOOD   ? estradioL (ESTRACE) 0.5 mg tablet TAKE ONE TABLET BY MOUTH DAILY.   ? ezetimibe (ZETIA) 10 mg tablet Take one tablet by mouth daily.   ? famotidine (PEPCID) 40 mg tablet Take one tablet by mouth daily.   ? fluticasone propionate (FLONASE) 50 mcg/actuation nasal spray, suspension USE 2 SPRAYS in each nostril AS DIRECTED DAILY. shake BEFORE USE   ? levothyroxine (SYNTHROID) 150 mcg tablet TAKE ONE TABLET BY MOUTH DAILY 30 MINUTES BEFORE BREAKFAST.   ? lidocaine viscous 2 % solution Take 15 mL by mouth every 3-4 hours as needed.   ? lisinopriL (ZESTRIL) 40 mg tablet TAKE ONE TABLET BY MOUTH DAILY.   ? methocarbamoL (ROBAXIN) 500 mg tablet TAKE ONE TABLET TO TWO TABLETS BY MOUTH FOUR TIMES DAILY AS NEEDED FOR SPASMS.   ? MULTIVITAMIN PO Take 1 tablet by mouth daily.   ? MYRBETRIQ 25 mg tablet TAKE 1 TABLET DAILY   ?  VASCEPA 1 gram capsule take 2 capsules BY MOUTH TWICE DAILY with meals     There were no vitals filed for this visit.  There is no height or weight on file to calculate BMI.     Telehealth Patient Reported Vitals     Row Name 11/06/19 0913                Weight:  68 kg (150 lb)        Height:  1.651 m (5' 5)        Pain Score:  Zero        Pain Location:  CHEST              Physical Exam  Comfortable at rest  No distress  Limited by telehealth       Assessment and Plan:  ? Elevated coronary artery calcium score    ? Essential hypertension ?   ? Mixed hyperlipidemia                  Tonya Williamson?has a history of hypertension hyperlipidemia and elevated calcium score/coronary artery disease. ?  ?  She has done very well from a cardiac standpoint with risk factor reduction.  Her latest lipid profile is excellent.  She should continue on her current lipid-lowering therapy..  ?   ?  I appreciate the opportunity of seeing?Tonya.?Threats?via telehealth today.  She will return for follow-up with me in 6 months with repeat lipid profile at that time.  ?           20-25 minutes spent on this patient's encounter with counseling and coordination of care taking >50% of the visit.

## 2019-11-06 NOTE — Patient Instructions
Dr. Gwen Pounds  would like for you to have fasting labs drawn in six months. If you have your labs drawn at a [facility] facility, you do not need to take orders with you. If you would like to have your labs drawn at an outside facility, you will need hard copies of your lab orders or we can fax them to the lab for you.     FOLLOW UP APPOINTMENT: PLEASE SCHEDULE BEFORE LEAVING TODAY    Please call scheduling at [phone]  to schedule your 6 month follow up appointment with Dr. Gwen Pounds.    *Please arrive 15 minutes before appointment time to allow our team to perform intake and room you appropriately*    In order to provide you the best care possible we ask that you follow up as below:    ? For questions, please call a team member on Dr. Kennis Carina nursing line at [phone] Monday - Friday 8-5 only.         Please leave a detailed message with your name, date of birth, and reason for your call.  A nurse will return your call as soon as possible.     ? You may also send [place] questions through your MyChart account.     ? For all medication refills please contact your pharmacy or send a request through MyChart.     ? Lab and test results:  As a part of the CARES act, starting 05/15/2019, some results will be released to you via mychart immediately and automatically.  You may see results before your provider sees them; however, your provider will review all these results and then they, or one of their team, will notify you of result information and recommendations.   Critical results will be addressed immediately, but otherwise, please allow [place] time to get back with you prior to you reaching out to [place] for questions.  This will usually take about 72 hours for labs and 5-7 days for procedure test results.        1. To schedule an appointment call [phone]  2. To Schedule a procedure/Test call [phone]  3. To schedule with EP call [phone]     It was truly our pleasure seeing you today. Thank you for choosing [facility] for your heart health!    Instructions given by Caleen Jobs, RN

## 2019-11-11 ENCOUNTER — Encounter: Admit: 2019-11-11 | Discharge: 2019-11-11 | Payer: MEDICARE

## 2019-11-11 ENCOUNTER — Ambulatory Visit: Admit: 2019-11-11 | Discharge: 2019-11-11 | Payer: MEDICARE

## 2019-11-11 DIAGNOSIS — E039 Hypothyroidism, unspecified: Secondary | ICD-10-CM

## 2019-11-11 DIAGNOSIS — E785 Hyperlipidemia, unspecified: Secondary | ICD-10-CM

## 2019-11-11 DIAGNOSIS — K289 Gastrojejunal ulcer, unspecified as acute or chronic, without hemorrhage or perforation: Secondary | ICD-10-CM

## 2019-11-11 DIAGNOSIS — M1990 Unspecified osteoarthritis, unspecified site: Secondary | ICD-10-CM

## 2019-11-11 DIAGNOSIS — Z23 Encounter for immunization: Secondary | ICD-10-CM

## 2019-11-11 DIAGNOSIS — J302 Other seasonal allergic rhinitis: Secondary | ICD-10-CM

## 2019-11-11 DIAGNOSIS — R232 Flushing: Secondary | ICD-10-CM

## 2019-11-11 DIAGNOSIS — I10 Essential (primary) hypertension: Secondary | ICD-10-CM

## 2019-11-11 DIAGNOSIS — R42 Dizziness and giddiness: Secondary | ICD-10-CM

## 2019-11-11 DIAGNOSIS — N390 Urinary tract infection, site not specified: Secondary | ICD-10-CM

## 2019-11-11 DIAGNOSIS — F329 Major depressive disorder, single episode, unspecified: Secondary | ICD-10-CM

## 2019-11-11 DIAGNOSIS — G43909 Migraine, unspecified, not intractable, without status migrainosus: Secondary | ICD-10-CM

## 2019-11-11 DIAGNOSIS — K219 Gastro-esophageal reflux disease without esophagitis: Secondary | ICD-10-CM

## 2019-11-11 NOTE — Progress Notes
Date of Service: 11/11/2019    Tonya Williamson is a 74 y.o. female. DOB: January 13, 1946   MRN#: [id number]    Subjective:       74 yo female been having hot flashes for the past 2 -3 months- having 2-3 times per day-last 5 minutes or less having to fan herself- is sweating on her forehead with them  Had hot flashes initially when she had her hysterectomy- was started on HRT then when she was 42  If has had her HRT on hold for a surgery - has the same symptoms as she has now  Hx of elevated CAC score-neg stress test 12-19- hyperlipidemia- htn- hypothyroidism- GERD- chronic neck pain  Going to have her rt shoulder replaced next March with Dr .Fabio Pierce  Reviewed hx in epic briefly           Review of Systems   Constitutional: Positive for diaphoresis. Negative for chills and fever.         Objective:     ? amitriptyline (ELAVIL) 25 mg tablet TAKE ONE TABLET BY MOUTH AT BEDTIME DAILY.   ? amLODIPine (NORVASC) 10 mg tablet TAKE ONE TABLET BY MOUTH DAILY.   ? atorvastatin (LIPITOR) 80 mg tablet Take one half tablet every evening   ? benzonatate (TESSALON PERLES) 100 mg capsule Take two capsules by mouth every 8 hours as needed for Cough.   ? CALCIUM CARBONATE/VITAMIN D3 (CALCIUM + D PO) Take 1 tablet by mouth daily.   ? celecoxib (CELEBREX) 200 mg capsule Take one capsule by mouth daily.   ? dicyclomine (BENTYL) 10 mg capsule TAKE ONE CAPSULE BY MOUTH TWICE DAILY.   ? duloxetine DR (CYMBALTA) 60 mg capsule TAKE ONE CAPSULE BY MOUTH DAILY.   ? esomeprazole DR (NEXIUM) 40 mg capsule TAKE 1 CAPSULE TWICE DAILY ON AN EMPTY STOMACH AT     LEAST 1 HOUR BEFORE OR 2   HOURS AFTER FOOD   ? estradioL (ESTRACE) 0.5 mg tablet TAKE ONE TABLET BY MOUTH DAILY.   ? ezetimibe (ZETIA) 10 mg tablet Take one tablet by mouth daily.   ? famotidine (PEPCID) 40 mg tablet Take one tablet by mouth daily.   ? fluticasone propionate (FLONASE) 50 mcg/actuation nasal spray, suspension USE 2 SPRAYS in each nostril AS DIRECTED DAILY. shake BEFORE USE   ? levothyroxine (SYNTHROID) 150 mcg tablet TAKE ONE TABLET BY MOUTH DAILY 30 MINUTES BEFORE BREAKFAST.   ? lidocaine viscous 2 % solution Take 15 mL by mouth every 3-4 hours as needed.   ? lisinopriL (ZESTRIL) 40 mg tablet TAKE ONE TABLET BY MOUTH DAILY.   ? methocarbamoL (ROBAXIN) 500 mg tablet TAKE ONE TABLET TO TWO TABLETS BY MOUTH FOUR TIMES DAILY AS NEEDED FOR SPASMS.   ? MULTIVITAMIN PO Take 1 tablet by mouth daily.   ? MYRBETRIQ 25 mg tablet TAKE 1 TABLET DAILY   ? VASCEPA 1 gram capsule take 2 capsules BY MOUTH TWICE DAILY with meals     Vitals:    11/11/19 1246   BP: 134/82   BP Source: Arm, Left Upper   Patient Position: Sitting   Pulse: 65   Temp: 36.7 ?C (98 ?F)   TempSrc: Temporal   SpO2: 98%   Weight: 70.3 kg (155 lb)   Height: 165.1 cm (65)   PainSc: Zero     Body mass index is 25.79 kg/m?Marland Kitchen     Physical Exam  Vitals and nursing note reviewed.   Constitutional:  General: She is not in acute distress.     Appearance: Normal appearance. She is well-developed and normal weight. She is not ill-appearing.   HENT:      Head: Normocephalic and atraumatic.      Right Ear: Tympanic membrane and ear canal normal.      Left Ear: Tympanic membrane and ear canal normal.      Mouth/Throat:      Mouth: Mucous membranes are moist.      Pharynx: Oropharynx is clear.   Eyes:      Conjunctiva/sclera: Conjunctivae normal.      Pupils: Pupils are equal, round, and reactive to light.   Neck:      Thyroid: No thyromegaly.   Cardiovascular:      Rate and Rhythm: Normal rate and regular rhythm.      Heart sounds: No murmur heard.     Pulmonary:      Effort: Pulmonary effort is normal.      Breath sounds: Normal breath sounds.   Abdominal:      General: Bowel sounds are normal. There is no distension.      Palpations: Abdomen is soft. There is no mass.      Tenderness: There is no abdominal tenderness.   Musculoskeletal:      Cervical back: Neck supple.      Right lower leg: No edema.      Left lower leg: No edema. Lymphadenopathy:      Cervical: No cervical adenopathy.   Skin:     General: Skin is warm and dry.   Neurological:      General: No focal deficit present.      Mental Status: She is alert and oriented to person, place, and time.   Psychiatric:         Mood and Affect: Mood normal.         Behavior: Behavior normal.         Thought Content: Thought content normal.         Judgment: Judgment normal.         A/p  Hot flashes - will taking 0.75 mg of estrace and see if sxs resolve- check tsh and cbc with diff  Flu shot today  Hypothyroidism- check tsh  htn- good control- check chem  Hyperlipidemia- excellent control - been seeing cardiology  rtc prn

## 2019-11-12 LAB — COMPREHENSIVE METABOLIC PANEL
Lab: 0.3 mg/dL (ref 0.3–1.2)
Lab: 137 MMOL/L (ref 137–147)
Lab: 18 U/L (ref 7–40)
Lab: 26 mg/dL — ABNORMAL HIGH (ref 7–25)
Lab: 4.7 g/dL (ref 3.5–5.0)
Lab: 61 U/L (ref 25–110)
Lab: 7.4 g/dL (ref 6.0–8.0)
Lab: 87 mg/dL (ref 70–100)
Lab: 9 10*3/uL (ref 3–12)
Lab: 9.8 mg/dL (ref 8.5–10.6)
Lab: >60 mL/min (ref >60)
Lab: >60 mL/min (ref >60)

## 2019-11-12 LAB — CBC AND DIFF
Lab: 0 10*3/uL (ref 0–0.20)
Lab: 14 % (ref 11–15)
Lab: 4.2 M/UL (ref >60)
Lab: 40 % (ref 36–45)
Lab: 7.6 K/UL (ref >60)

## 2019-11-12 LAB — TSH WITH FREE T4 REFLEX: Lab: 0.4 uU/mL (ref 0.35–5.00)

## 2019-11-14 ENCOUNTER — Encounter: Admit: 2019-11-14 | Discharge: 2019-11-14 | Payer: MEDICARE

## 2019-11-14 MED ORDER — FAMOTIDINE 40 MG PO TAB
40 mg | ORAL_TABLET | Freq: Every day | ORAL | 3 refills | 90.00000 days | Status: AC
Start: 2019-11-14 — End: ?

## 2019-11-15 ENCOUNTER — Encounter: Admit: 2019-11-15 | Discharge: 2019-11-15 | Payer: MEDICARE

## 2019-11-15 MED ORDER — CELECOXIB 200 MG PO CAP
ORAL_CAPSULE | Freq: Every day | 3 refills | Status: AC
Start: 2019-11-15 — End: ?

## 2019-11-30 ENCOUNTER — Encounter: Admit: 2019-11-30 | Discharge: 2019-11-30 | Payer: MEDICARE

## 2019-11-30 MED ORDER — ESOMEPRAZOLE MAGNESIUM 40 MG PO CPDR
ORAL_CAPSULE | Freq: Two times a day (BID) | 3 refills | 90.00000 days | Status: AC
Start: 2019-11-30 — End: ?

## 2019-12-12 ENCOUNTER — Encounter: Admit: 2019-12-12 | Discharge: 2019-12-12 | Payer: MEDICARE

## 2019-12-12 DIAGNOSIS — Z1231 Encounter for screening mammogram for malignant neoplasm of breast: Secondary | ICD-10-CM

## 2019-12-12 MED ORDER — ESTRADIOL 0.5 MG PO TAB
.75 mg | ORAL_TABLET | Freq: Every day | ORAL | 3 refills | 30.00000 days | Status: AC
Start: 2019-12-12 — End: ?

## 2019-12-12 NOTE — Telephone Encounter
LOV 11/11/19 hot flashes     Dose increase at last visit - pt needs new script for 0.75

## 2019-12-17 ENCOUNTER — Encounter: Admit: 2019-12-17 | Discharge: 2019-12-17 | Payer: MEDICARE

## 2019-12-17 MED ORDER — LISINOPRIL 40 MG PO TAB
ORAL_TABLET | Freq: Every day | 3 refills | Status: AC
Start: 2019-12-17 — End: ?

## 2019-12-17 MED ORDER — AMITRIPTYLINE 25 MG PO TAB
25 mg | ORAL_TABLET | Freq: Every evening | ORAL | 3 refills | Status: AC
Start: 2019-12-17 — End: ?

## 2019-12-17 MED ORDER — AMLODIPINE 10 MG PO TAB
ORAL_TABLET | Freq: Every day | 3 refills | Status: AC
Start: 2019-12-17 — End: ?

## 2019-12-17 MED ORDER — DULOXETINE 60 MG PO CPDR
60 mg | ORAL_CAPSULE | Freq: Every day | ORAL | 3 refills | 60.00000 days | Status: AC
Start: 2019-12-17 — End: ?

## 2019-12-21 ENCOUNTER — Encounter: Admit: 2019-12-21 | Discharge: 2019-12-21 | Payer: MEDICARE

## 2019-12-21 MED ORDER — DICYCLOMINE 10 MG PO CAP
ORAL_CAPSULE | Freq: Two times a day (BID) | 3 refills | Status: AC
Start: 2019-12-21 — End: ?

## 2019-12-30 ENCOUNTER — Ambulatory Visit: Admit: 2019-12-30 | Discharge: 2019-12-30 | Payer: MEDICARE

## 2019-12-30 ENCOUNTER — Encounter: Admit: 2019-12-30 | Discharge: 2019-12-30 | Payer: MEDICARE

## 2019-12-30 DIAGNOSIS — Z1231 Encounter for screening mammogram for malignant neoplasm of breast: Secondary | ICD-10-CM

## 2019-12-31 ENCOUNTER — Encounter: Admit: 2019-12-31 | Discharge: 2019-12-31 | Payer: MEDICARE

## 2019-12-31 DIAGNOSIS — N390 Urinary tract infection, site not specified: Secondary | ICD-10-CM

## 2019-12-31 DIAGNOSIS — K289 Gastrojejunal ulcer, unspecified as acute or chronic, without hemorrhage or perforation: Secondary | ICD-10-CM

## 2019-12-31 DIAGNOSIS — K219 Gastro-esophageal reflux disease without esophagitis: Secondary | ICD-10-CM

## 2019-12-31 DIAGNOSIS — G43909 Migraine, unspecified, not intractable, without status migrainosus: Secondary | ICD-10-CM

## 2019-12-31 DIAGNOSIS — R42 Dizziness and giddiness: Secondary | ICD-10-CM

## 2019-12-31 DIAGNOSIS — F32A Depression: Secondary | ICD-10-CM

## 2019-12-31 DIAGNOSIS — J302 Other seasonal allergic rhinitis: Secondary | ICD-10-CM

## 2019-12-31 DIAGNOSIS — E039 Hypothyroidism, unspecified: Secondary | ICD-10-CM

## 2019-12-31 DIAGNOSIS — E785 Hyperlipidemia, unspecified: Secondary | ICD-10-CM

## 2019-12-31 DIAGNOSIS — M1990 Unspecified osteoarthritis, unspecified site: Secondary | ICD-10-CM

## 2020-01-14 ENCOUNTER — Encounter: Admit: 2020-01-14 | Discharge: 2020-01-14 | Payer: MEDICARE

## 2020-01-14 NOTE — Telephone Encounter
States she is having headaches and drainage down th back of her throat - would like to know if Dr. Reita Cliche could send an antibiotic to her pharmacy?  Returned call @ 1029 - left message on voicemail to call back.

## 2020-01-18 ENCOUNTER — Encounter: Admit: 2020-01-18 | Discharge: 2020-01-18 | Payer: MEDICARE

## 2020-01-18 DIAGNOSIS — N3941 Urge incontinence: Secondary | ICD-10-CM

## 2020-01-18 MED ORDER — MYRBETRIQ 25 MG PO TB24
ORAL_TABLET | Freq: Every day | 3 refills | Status: AC
Start: 2020-01-18 — End: ?

## 2020-01-20 ENCOUNTER — Encounter: Admit: 2020-01-20 | Discharge: 2020-01-20 | Payer: MEDICARE

## 2020-01-20 DIAGNOSIS — E039 Hypothyroidism, unspecified: Secondary | ICD-10-CM

## 2020-01-20 MED ORDER — FAMOTIDINE 40 MG PO TAB
40 mg | ORAL_TABLET | Freq: Every day | ORAL | 0 refills | 90.00000 days | Status: AC
Start: 2020-01-20 — End: ?

## 2020-01-20 MED ORDER — ESTRADIOL 0.5 MG PO TAB
.75 mg | ORAL_TABLET | Freq: Every day | ORAL | 0 refills | 43.00000 days | Status: AC
Start: 2020-01-20 — End: ?

## 2020-01-20 MED ORDER — AMLODIPINE 10 MG PO TAB
10 mg | ORAL_TABLET | Freq: Every day | ORAL | 0 refills | Status: AC
Start: 2020-01-20 — End: ?

## 2020-01-20 MED ORDER — DULOXETINE 60 MG PO CPDR
60 mg | ORAL_CAPSULE | Freq: Every day | ORAL | 0 refills | 60.00000 days | Status: AC
Start: 2020-01-20 — End: ?

## 2020-01-20 MED ORDER — EZETIMIBE 10 MG PO TAB
10 mg | ORAL_TABLET | Freq: Every day | ORAL | 0 refills | Status: AC
Start: 2020-01-20 — End: ?

## 2020-01-20 MED ORDER — AMITRIPTYLINE 25 MG PO TAB
25 mg | ORAL_TABLET | Freq: Every evening | ORAL | 0 refills | Status: AC
Start: 2020-01-20 — End: ?

## 2020-01-20 MED ORDER — LEVOTHYROXINE 150 MCG PO TAB
150 ug | ORAL_TABLET | Freq: Every day | ORAL | 0 refills | 30.00000 days | Status: AC
Start: 2020-01-20 — End: ?

## 2020-01-20 MED ORDER — LISINOPRIL 40 MG PO TAB
40 mg | ORAL_TABLET | Freq: Every day | ORAL | 0 refills | Status: AC
Start: 2020-01-20 — End: ?

## 2020-01-20 MED ORDER — ICOSAPENT ETHYL 1 GRAM PO CAP
ORAL_CAPSULE | Freq: Two times a day (BID) | 0 refills | 30.00000 days | Status: AC
Start: 2020-01-20 — End: ?

## 2020-01-20 MED ORDER — DICYCLOMINE 10 MG PO CAP
10 mg | ORAL_CAPSULE | Freq: Two times a day (BID) | ORAL | 0 refills | Status: AC
Start: 2020-01-20 — End: ?

## 2020-01-20 NOTE — Telephone Encounter
Patient called stating she will be in [place] for 2 months and will need refills on all her meds sent to [facility] at [facility] [phone].    LOV 09/28/21hot flashes

## 2020-01-23 ENCOUNTER — Encounter: Admit: 2020-01-23 | Discharge: 2020-01-23 | Payer: MEDICARE

## 2020-01-23 MED ORDER — METHOCARBAMOL 500 MG PO TAB
500-1000 mg | ORAL_TABLET | Freq: Four times a day (QID) | ORAL | 11 refills | Status: AC | PRN
Start: 2020-01-23 — End: ?

## 2020-01-23 NOTE — Telephone Encounter
LOV 11/11/19 hot flashes

## 2020-02-02 ENCOUNTER — Encounter: Admit: 2020-02-02 | Discharge: 2020-02-02 | Payer: MEDICARE

## 2020-02-02 MED ORDER — FAMOTIDINE 40 MG PO TAB
40 mg | ORAL_TABLET | Freq: Every day | ORAL | 3 refills | 90.00000 days | Status: AC
Start: 2020-02-02 — End: ?

## 2020-02-10 ENCOUNTER — Encounter: Admit: 2020-02-10 | Discharge: 2020-02-10 | Payer: MEDICARE

## 2020-02-10 NOTE — Telephone Encounter
Pt called saying that she is having shoulder surgery on March 22nd and her surgeon is requesting cardiac risk stratification. Message sent to Dr. Gwen Pounds to review.     Surgeon: Dr. Lanney Gins at [facility].  PA: Alcario Drought    Fax: [phone]    Pt states she will be on vacation the month of January and February and will return on March 3rd.

## 2020-03-16 ENCOUNTER — Encounter: Admit: 2020-03-16 | Discharge: 2020-03-16 | Payer: MEDICARE

## 2020-03-16 MED ORDER — FLUTICASONE PROPIONATE 50 MCG/ACTUATION NA SPSN
Freq: Every day | NASAL | 3 refills | 60.00000 days | Status: AC
Start: 2020-03-16 — End: ?

## 2020-03-16 NOTE — Telephone Encounter
LOV  11/11/19 hot flashes

## 2020-04-13 ENCOUNTER — Encounter: Admit: 2020-04-13 | Discharge: 2020-04-13 | Payer: MEDICARE

## 2020-04-13 MED ORDER — VASCEPA 1 GRAM PO CAP
ORAL_CAPSULE | Freq: Two times a day (BID) | 3 refills | Status: AC
Start: 2020-04-13 — End: ?

## 2020-04-19 ENCOUNTER — Encounter: Admit: 2020-04-19 | Discharge: 2020-04-19 | Payer: MEDICARE

## 2020-04-19 ENCOUNTER — Ambulatory Visit: Admit: 2020-04-19 | Discharge: 2020-04-20 | Payer: MEDICARE

## 2020-04-19 DIAGNOSIS — K289 Gastrojejunal ulcer, unspecified as acute or chronic, without hemorrhage or perforation: Secondary | ICD-10-CM

## 2020-04-19 DIAGNOSIS — E039 Hypothyroidism, unspecified: Secondary | ICD-10-CM

## 2020-04-19 DIAGNOSIS — Z01810 Encounter for preprocedural cardiovascular examination: Secondary | ICD-10-CM

## 2020-04-19 DIAGNOSIS — I10 Essential (primary) hypertension: Secondary | ICD-10-CM

## 2020-04-19 DIAGNOSIS — E782 Mixed hyperlipidemia: Secondary | ICD-10-CM

## 2020-04-19 DIAGNOSIS — G43909 Migraine, unspecified, not intractable, without status migrainosus: Secondary | ICD-10-CM

## 2020-04-19 DIAGNOSIS — E785 Hyperlipidemia, unspecified: Secondary | ICD-10-CM

## 2020-04-19 DIAGNOSIS — N390 Urinary tract infection, site not specified: Secondary | ICD-10-CM

## 2020-04-19 DIAGNOSIS — M1990 Unspecified osteoarthritis, unspecified site: Secondary | ICD-10-CM

## 2020-04-19 DIAGNOSIS — R931 Abnormal findings on diagnostic imaging of heart and coronary circulation: Secondary | ICD-10-CM

## 2020-04-19 DIAGNOSIS — F32A Depression: Secondary | ICD-10-CM

## 2020-04-19 DIAGNOSIS — R42 Dizziness and giddiness: Secondary | ICD-10-CM

## 2020-04-19 DIAGNOSIS — J302 Other seasonal allergic rhinitis: Secondary | ICD-10-CM

## 2020-04-19 DIAGNOSIS — K219 Gastro-esophageal reflux disease without esophagitis: Secondary | ICD-10-CM

## 2020-04-19 NOTE — Progress Notes
Telehealth Visit Note    Date of Service: 04/19/2020    Subjective:      Obtained patient's verbal consent to treat them and their agreement to [place] financial policy and NPP via this telehealth visit during the [facility]       Tonya Williamson is a 75 y.o. female.    History of Present Illness  75 year-old female with a history of hypertension hyperlipidemia hypothyroidism and coronary artery disease by elevated calcium score of 217 in 2019 returns for follow-up visit?via telehealth.    She spent the last couple of months in [place] and just returned on 1 March.  She is due to have shoulder surgery on 22 March.  ?  She has been doing very well with diet weight loss and exercise.  She is involved in water aerobics but this has been on hold because of her shoulder injury.  She is due to have shoulder replacement.  She denies any chest pain shortness of breath PND orthopnea or swelling of ankles.    She describes an exercise capacity of greater than 4 METS being able to walk up 2 flights of stairs carrying groceries without difficulty.  She remains active walking while away in [place]..  ?  In 2019 she?underwent a regadenoson thallium stress test which showed an ejection fraction of 79% and no evidence of myocardial ischemia.  ?  An echocardiogram also at that time revealed EF of 60 to 65% with mild tricuspid regurgitation. ?Estimated peak systolic PA pressure was 28 mmHg  ?  Her latest lipid profile shows an LDL of 48 and triglycerides of 121.  She is on a atorvastatin Zetia and Vascepa.  ?       Review of Systems   Constitutional: Negative.    HENT: Negative.    Eyes: Negative.    Respiratory: Negative.    Cardiovascular: Negative.    Gastrointestinal: Negative.    Endocrine: Negative.    Genitourinary: Negative.    Musculoskeletal: Negative.    Skin: Negative.    Allergic/Immunologic: Negative.    Neurological: Negative.    Hematological: Negative.    Psychiatric/Behavioral: Negative. Objective:         ? amitriptyline (ELAVIL) 25 mg tablet Take one tablet by mouth at bedtime daily.   ? amLODIPine (NORVASC) 10 mg tablet Take one tablet by mouth daily.   ? amoxicillin-potassium clavulanate (AUGMENTIN) 875/125 mg tablet Take one tablet by mouth every 12 hours. Take with food.   ? atorvastatin (LIPITOR) 80 mg tablet Take one half tablet every evening (Patient taking differently: 40 mg. Take one half tablet every evening)   ? benzonatate (TESSALON PERLES) 100 mg capsule Take two capsules by mouth every 8 hours as needed for Cough.   ? CALCIUM CARBONATE/VITAMIN D3 (CALCIUM + D PO) Take 1 tablet by mouth daily.   ? celecoxib (CELEBREX) 200 mg capsule TAKE 1 CAPSULE DAILY   ? dicyclomine (BENTYL) 10 mg capsule Take one capsule by mouth twice daily.   ? duloxetine DR (CYMBALTA) 60 mg capsule Take one capsule by mouth daily.   ? esomeprazole DR (NEXIUM) 40 mg capsule TAKE 1 CAPSULE TWICE DAILY ON AN EMPTY STOMACH AT     LEAST 1 HOUR BEFORE OR 2   HOURS AFTER FOOD   ? estradioL (ESTRACE) 0.5 mg tablet Take 1.5 tablets by mouth daily.   ? ezetimibe (ZETIA) 10 mg tablet Take one tablet by mouth daily.   ? famotidine (PEPCID) 40 mg  tablet TAKE ONE TABLET BY MOUTH DAILY   ? fluticasone propionate (FLONASE) 50 mcg/actuation nasal spray, suspension USE 2 SPRAYS in each nostril AS DIRECTED DAILY. shake BEFORE USE   ? levothyroxine (SYNTHROID) 150 mcg tablet Take one tablet by mouth daily 30 minutes before breakfast.   ? lidocaine viscous 2 % solution Take 15 mL by mouth every 3-4 hours as needed.   ? lisinopriL (ZESTRIL) 40 mg tablet Take one tablet by mouth daily.   ? methocarbamoL (ROBAXIN) 500 mg tablet TAKE ONE TABLET TO TWO TABLETS BY MOUTH FOUR TIMES DAILY AS NEEDED FOR SPASMS.   ? MULTIVITAMIN PO Take 1 tablet by mouth daily.   ? MYRBETRIQ 25 mg tablet TAKE 1 TABLET DAILY   ? VASCEPA 1 gram capsule take 2 capsules BY MOUTH TWICE DAILY with meals          Telehealth Patient Reported Vitals     Row Name 04/19/20 0818                Weight: 69.4 kg (153 lb)        Height: 165.1 cm (5' 5)        Pain Score: Zero        Pain Location: CHEST                  Telehealth Body Mass Index: 25.46 at 04/19/2020  8:52 AM    Physical Exam  Limited by telehealth  Comfortable at rest  No distress       Assessment and Plan:      Problem   Preop Cardiovascular Exam   Essential hypertension  Elevated coronary calcium score  Mixed hyperlipidemia           Tonya Williamson?has a history of hypertension hyperlipidemia and elevated calcium score/coronary artery disease. ?She is due to have shoulder surgery later this month.    From a cardiac standpoint she is asymptomatic.  She describes an exercise capacity of greater than 4 METS.      Within the past 3 years she did have a nonischemic nuclear stress test as well as echocardiogram demonstrating no significant valvular disease.    She is due to have an ECG at her primary care physician's office.  We have asked for a copy of this.    Providing the ECG is normal, it would be my impression that she would be a low risk candidate for cardiovascular complications from intermediate risk noncardiac surgery.    She should continue with a heart healthy diet and regular exercise.??  ?  I appreciate the opportunity of seeing?Tonya.?Williamson?via telehealth today.??She will return for follow-up with me in 6 months.  She should have lipids drawn by her PCP.           35-40 minutes total spent on this patient's encounter including documentation and record review with counseling and coordination of care taking >50% of the visit.

## 2020-04-19 NOTE — Telephone Encounter
Spoke with patient regarding Dr. Afaq's recommendations from today's telehealth visit. All questions answered and pt verbalized understanding of all information given. Pt verbalized agreement with plan of care.

## 2020-04-27 ENCOUNTER — Ambulatory Visit: Admit: 2020-04-27 | Discharge: 2020-04-27 | Payer: MEDICARE

## 2020-04-27 ENCOUNTER — Encounter: Admit: 2020-04-27 | Discharge: 2020-04-27 | Payer: MEDICARE

## 2020-04-27 DIAGNOSIS — F32A Depression: Secondary | ICD-10-CM

## 2020-04-27 DIAGNOSIS — R931 Abnormal findings on diagnostic imaging of heart and coronary circulation: Secondary | ICD-10-CM

## 2020-04-27 DIAGNOSIS — E785 Hyperlipidemia, unspecified: Secondary | ICD-10-CM

## 2020-04-27 DIAGNOSIS — Z01818 Encounter for other preprocedural examination: Secondary | ICD-10-CM

## 2020-04-27 DIAGNOSIS — Z0000 Encounter for general adult medical examination without abnormal findings: Secondary | ICD-10-CM

## 2020-04-27 DIAGNOSIS — T148XXA Other injury of unspecified body region, initial encounter: Secondary | ICD-10-CM

## 2020-04-27 DIAGNOSIS — E782 Mixed hyperlipidemia: Secondary | ICD-10-CM

## 2020-04-27 DIAGNOSIS — K219 Gastro-esophageal reflux disease without esophagitis: Secondary | ICD-10-CM

## 2020-04-27 DIAGNOSIS — K289 Gastrojejunal ulcer, unspecified as acute or chronic, without hemorrhage or perforation: Secondary | ICD-10-CM

## 2020-04-27 DIAGNOSIS — I10 Essential (primary) hypertension: Secondary | ICD-10-CM

## 2020-04-27 DIAGNOSIS — N390 Urinary tract infection, site not specified: Secondary | ICD-10-CM

## 2020-04-27 DIAGNOSIS — E039 Hypothyroidism, unspecified: Secondary | ICD-10-CM

## 2020-04-27 DIAGNOSIS — J302 Other seasonal allergic rhinitis: Secondary | ICD-10-CM

## 2020-04-27 DIAGNOSIS — G43909 Migraine, unspecified, not intractable, without status migrainosus: Secondary | ICD-10-CM

## 2020-04-27 DIAGNOSIS — M1990 Unspecified osteoarthritis, unspecified site: Secondary | ICD-10-CM

## 2020-04-27 DIAGNOSIS — R42 Dizziness and giddiness: Secondary | ICD-10-CM

## 2020-04-27 DIAGNOSIS — Z780 Asymptomatic menopausal state: Secondary | ICD-10-CM

## 2020-04-27 DIAGNOSIS — M8580 Other specified disorders of bone density and structure, unspecified site: Secondary | ICD-10-CM

## 2020-04-27 LAB — URINALYSIS DIPSTICK REFLEX TO CULTURE
Lab: 1 % — ABNORMAL HIGH (ref 1.005–1.030)
Lab: 6 % — ABNORMAL LOW (ref >60)
Lab: NEGATIVE
Lab: NEGATIVE
Lab: NEGATIVE
Lab: NEGATIVE % (ref 0–2)
Lab: NEGATIVE K/UL (ref 0–0.45)
Lab: NEGATIVE K/UL (ref 0–0.80)
Lab: NEGATIVE K/UL (ref 1.0–4.8)
Lab: NEGATIVE K/UL (ref 1.8–7.0)

## 2020-04-27 LAB — CBC AND DIFF
Lab: 0 K/UL (ref 0–0.20)
Lab: 0.1 K/UL (ref 0–0.45)
Lab: 0.3 K/UL (ref 0–0.80)
Lab: 1 % (ref 0–2)
Lab: 1.8 K/UL (ref 1.0–4.8)
Lab: 12 g/dL (ref 12.0–15.0)
Lab: 13 % (ref 11–15)
Lab: 2 K/UL (ref 1.8–7.0)
Lab: 300 K/UL (ref 150–400)
Lab: 31 pg (ref 26–34)
Lab: 33 g/dL (ref 32.0–36.0)
Lab: 38 % (ref 36–45)
Lab: 4 % (ref 0–5)
Lab: 4 M/UL (ref 4.0–5.0)
Lab: 4.5 K/UL (ref 4.5–11.0)
Lab: 42 % (ref >60)
Lab: 45 % (ref 41–77)
Lab: 7.6 FL (ref 7–11)
Lab: 8 % (ref 4–12)
Lab: 94 FL (ref 80–100)

## 2020-04-27 LAB — COMPREHENSIVE METABOLIC PANEL
Lab: 141 MMOL/L (ref 137–147)
Lab: 4.5 MMOL/L (ref 3.5–5.1)
Lab: 87 mg/dL (ref 70–100)

## 2020-04-27 LAB — SED RATE: Lab: 10 mm/h (ref 0–30)

## 2020-04-27 LAB — PTT (APTT): Lab: 28 s (ref 24.0–36.5)

## 2020-04-27 LAB — URINALYSIS MICROSCOPIC REFLEX TO CULTURE

## 2020-04-27 LAB — PROTIME INR (PT)
Lab: 0.9 (ref 0.8–1.2)
Lab: 10 s (ref 8.5–14.4)

## 2020-04-27 LAB — LIPID PROFILE
Lab: 108 mg/dL — ABNORMAL HIGH (ref <200)
Lab: 166 mg/dL — ABNORMAL HIGH (ref <150)
Lab: 46 mg/dL (ref >40)
Lab: 48 mg/dL (ref <100)
Lab: 62 mg/dL

## 2020-04-27 LAB — TSH WITH FREE T4 REFLEX: Lab: 0.4 uU/mL (ref 0.35–5.00)

## 2020-04-27 NOTE — Progress Notes
Date of Service: 04/27/2020    Tonya Williamson is a 74 y.o. female. DOB: 08-29-1945   MRN#: [id number]    Subjective:       75 yo female going to have her rt shoulder replacement on 05-04-20 with Dr. Lanney Gins at [facility]  Needs a preop EKG- cxr and lab orders  Feeling well overall- here also for an annual wellness visit  Reviewed hx in epic briefly   Hx of osteopenia- elevated CAC- htn- hyperlipidemia- hypothyroidism- GERD  Reviewed HRA  Health Risk Assessment Questionnaire  Current Care  List of Providers you have seen in the last two years: Dr Gwen Pounds, Dr Crista Luria, Dr Reita Cliche  Are you receiving home health?: (!) Yes  During the past 4 weeks, how would you rate your health in general?: Very Good    Outside Care  Since your last PCP visit, have you received care outside of [facility]?: (!) Yes  What type of care did you receive outside of [facility]? (select all that apply): (!) Specialty Visit  What is the Facility where you received care and the provider's name?: [facility] - Dr Fabio Pierce    Physical Activity  Do you exercise or are you physically active?: Yes  How many days a week do you usually exercise or are physically active?: 3  On days when you exercised or were physically active, how many minutes was the activity?: 60  During the past four weeks, what was the hardest physical activity you could do for at least two minutes?: Heavy    Diet  In the past month, were you worried whether your food would run out before you or your family had money to buy more?: No  In the past 7 days, how many times did you eat fast food or junk food or pizza?: 1  In the past 7 days, how many servings of fruits or vegetables did you eat each day?: 4-5  In the past 7 days, how many sodas and sugar sweetened drinks (regular, not diet) did you drink each day?: 0    Smoke/Tobacco Use  Are you currently a smoker?: No    Alcohol Use  Do you drink alcohol?: Yes  Are you Female or Female?: Female  Female: In the last three months, have you had >3 alcoholic beverages in any one day or >7 in any one week?: (!) Yes    Depression Screen  Little interest or pleasure in doing things: Not at All  Feeling down, depressed or hopeless: Not at All    Pain  How would you rate your pain today?: Very mild pain    Ambulation  Do you use any assistive devices for ambulation?: No    Fall Risk  Does it take you longer than 30 seconds to get up and out of a chair?: No  Have you fallen in the past year?: No    Motor Vehicle Safety  Do you fasten your seat belt when you are in the car?: Yes    Sun Exposure  Do you protect yourself from the sun? For example, wear sunscreen when outside.: Yes    Hearing Loss  Do you have trouble hearing the television or radio when others do not?: No  Do you have to strain or struggle to hear/understand conversation?: No  Do you use hearing aids?: No    Cognitive Impairment  During the past 12 months, have you experienced  confusion or memory loss that is happening more often or is getting worse?: No    Functional Screen  Do you live alone?: Yes  Do you live at: Home  Can you drive your own car or travel alone by bus or taxi?: Yes  Can you shop for groceries or clothes without help?: Yes  Can you prepare your own meals?: Yes  Can you do your own housework without help?: Yes  Can you handle your own money without help?: Yes  Do you need help eating, bathing, dressing, or getting around your home?: No  Do you feel safe?: Yes  Does anyone at home hurt you, hit you, or threaten you?: No  Have you ever been the victim of abuse?: No    Home Safety  Does your home have grab bars in the bathroom?: (!) No  Does your home have hand rails on stairs and steps?: Yes  Does your home have functioning smoke alarms?: Yes    Advance Directive  Do you have a living will or Advance Directive?: (!) No  Are you interested in discussing the importance of a living will or Advance Directive?: No    Dental Screen  Have you had an exam by your dentist in the last year?: Yes    Vision Screen  Do you have diabetes?: No           Review of Systems   Constitutional: Negative for chills, diaphoresis and fever.   HENT: Negative for hearing loss.         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   Respiratory: Negative for cough and shortness of breath.    Hematologic/Lymphatic: Bruises/bleeds easily.   Skin: Negative.    Musculoskeletal: Positive for joint pain and neck pain.        Will be taking gabapentin - celebrex or mobic  -and tylenol for pain management   Gastrointestinal: Negative for constipation, diarrhea, hematochezia and melena.        Had abd pain on 04-23-20- had vomiting - no diarrhea-feels much better  Has GERD sxs sometimes- takes nexium   Genitourinary: Negative for dysuria and hematuria.        Urine was dark a few days ago- better now- may have gotten a little dry with her vomitin   Neurological:        Occ headaches   Psychiatric/Behavioral:        Seeping ok  Stress level is ok   Allergic/Immunologic:        Had some allergy sxs while in [place] in January and February         Objective:     ? amitriptyline (ELAVIL) 25 mg tablet Take one tablet by mouth at bedtime daily.   ? amLODIPine (NORVASC) 10 mg tablet Take one tablet by mouth daily.   ? amoxicillin-potassium clavulanate (AUGMENTIN) 875/125 mg tablet Take one tablet by mouth every 12 hours. Take with food.   ? atorvastatin (LIPITOR) 80 mg tablet Take one half tablet every evening (Patient taking differently: 40 mg. Take one half tablet every evening)   ? benzonatate (TESSALON PERLES) 100 mg capsule Take two capsules by mouth every 8 hours as needed for Cough.   ?  CALCIUM CARBONATE/VITAMIN D3 (CALCIUM + D PO) Take 1 tablet by mouth daily.   ? celecoxib (CELEBREX) 200 mg capsule TAKE 1 CAPSULE DAILY   ? dicyclomine (BENTYL) 10 mg capsule Take one capsule by mouth twice daily.   ? duloxetine DR (CYMBALTA) 60 mg capsule Take one capsule by mouth daily.   ? esomeprazole DR (NEXIUM) 40 mg capsule TAKE 1 CAPSULE TWICE DAILY ON AN EMPTY STOMACH AT     LEAST 1 HOUR BEFORE OR 2   HOURS AFTER FOOD   ? estradioL (ESTRACE) 0.5 mg tablet Take 1.5 tablets by mouth daily.   ? ezetimibe (ZETIA) 10 mg tablet Take one tablet by mouth daily.   ? famotidine (PEPCID) 40 mg tablet TAKE ONE TABLET BY MOUTH DAILY   ? fluticasone propionate (FLONASE) 50 mcg/actuation nasal spray, suspension USE 2 SPRAYS in each nostril AS DIRECTED DAILY. shake BEFORE USE   ? levothyroxine (SYNTHROID) 150 mcg tablet Take one tablet by mouth daily 30 minutes before breakfast.   ? lidocaine viscous 2 % solution Take 15 mL by mouth every 3-4 hours as needed.   ? lisinopriL (ZESTRIL) 40 mg tablet Take one tablet by mouth daily.   ? methocarbamoL (ROBAXIN) 500 mg tablet TAKE ONE TABLET TO TWO TABLETS BY MOUTH FOUR TIMES DAILY AS NEEDED FOR SPASMS.   ? MULTIVITAMIN PO Take 1 tablet by mouth daily.   ? MYRBETRIQ 25 mg tablet TAKE 1 TABLET DAILY   ? VASCEPA 1 gram capsule take 2 capsules BY MOUTH TWICE DAILY with meals     Vitals:    04/27/20 1021   BP: 138/72   BP Source: Arm, Left Upper   Patient Position: Sitting   Pulse: 67   Temp: 36.5 ?C (97.7 ?F)   TempSrc: Temporal   SpO2: 98%   Weight: 73.5 kg (162 lb)   Height: 166.4 cm (5' 5.5)   PainSc: Zero     Body mass index is 26.55 kg/m?Marland Kitchen     Physical Exam  Vitals and nursing note reviewed.   Constitutional:       Appearance: Normal appearance. She is well-developed.   HENT:      Head: Normocephalic and atraumatic.      Right Ear: Tympanic membrane and ear canal normal.      Left Ear: Tympanic membrane and ear canal normal.      Mouth/Throat:      Mouth: Mucous membranes are moist.      Pharynx: Oropharynx is clear.   Eyes: Conjunctiva/sclera: Conjunctivae normal.      Pupils: Pupils are equal, round, and reactive to light.   Neck:      Thyroid: No thyromegaly.   Cardiovascular:      Rate and Rhythm: Normal rate and regular rhythm.      Heart sounds: No murmur heard.  Pulmonary:      Effort: Pulmonary effort is normal.      Breath sounds: Normal breath sounds.   Abdominal:      General: Bowel sounds are normal. There is no distension.      Palpations: Abdomen is soft. There is no mass.      Tenderness: There is no abdominal tenderness.   Musculoskeletal:      Cervical back: Neck supple.      Right lower leg: No edema.      Left lower leg: No edema.   Lymphadenopathy:      Cervical: No cervical adenopathy.   Skin:     General:  Skin is warm and dry.   Neurological:      General: No focal deficit present.      Mental Status: She is alert and oriented to person, place, and time.   Psychiatric:         Mood and Affect: Mood normal.         Behavior: Behavior normal.         Thought Content: Thought content normal.         Judgment: Judgment normal.            a/p  Annual wellness visit- appears well- non smoker- bmi ok at 26.55-up to date on her immunizations and mammogram- due for her colonoscopy- pt will ask her orthopedist when ok to order- done with paps- will fill out the Medicare Wellness form and have mailed to the pt  preop testing for a rt shoulder replacement- ordered the requested ekg- cxr and lab- EKG with a NSR rate 61- no ischemic changes- cxr clear- as long as does not have alarming findings on her lab work- pt is cleared for surgery  Hyperlipidemia- check lab  htn- good control- bp was 130/80 on recheck  Hypothyroidism- check tsh  Bruising - check INR  Postmenopausal- osteopenia- check dexa  Elevated coronary artery calcium score- followed by cardiology- neg stress test 12-19  rtc in 1 yr and prn

## 2020-04-28 ENCOUNTER — Encounter: Admit: 2020-04-28 | Discharge: 2020-04-28 | Payer: MEDICARE

## 2020-04-28 NOTE — Progress Notes
Pre op labs, ekg, chest x ray and ov note from 04/27/20 faxed to Dr Lanney Gins at [phone]

## 2020-04-28 NOTE — Telephone Encounter
Spoke with patient regarding Dr. Afaq's recommendations. Patient verbalized understanding and agreed with plan of care.

## 2020-04-28 NOTE — Telephone Encounter
-----   Message from Leilani Merl, MD sent at 04/28/2020 10:41 AM CDT -----  Overall some improvement in triglycerides however slightly up from previously.  Continue current medications.  ----- Message -----  From: Rosine Abe, RN  Sent: 04/28/2020  10:21 AM CDT  To: Leilani Merl, MD    OV 04/19/2020 - Lipids drawn by PCP. LDL 48. Takes Lipitor 80mg  QD. Zetia 10mg  QD  Vascepa 2g cap BID

## 2020-05-04 ENCOUNTER — Encounter: Admit: 2020-05-04 | Discharge: 2020-05-04 | Payer: MEDICARE

## 2020-06-16 ENCOUNTER — Encounter: Admit: 2020-06-16 | Discharge: 2020-06-16 | Payer: MEDICARE

## 2020-06-16 MED ORDER — DICYCLOMINE 10 MG PO CAP
10 mg | ORAL_CAPSULE | Freq: Two times a day (BID) | ORAL | 0 refills | Status: AC
Start: 2020-06-16 — End: ?

## 2020-06-30 ENCOUNTER — Encounter: Admit: 2020-06-30 | Discharge: 2020-06-30 | Payer: MEDICARE

## 2020-06-30 MED ORDER — ESTRADIOL 0.5 MG PO TAB
ORAL_TABLET | Freq: Every day | 3 refills | 43.00000 days | Status: AC
Start: 2020-06-30 — End: ?

## 2020-06-30 MED ORDER — AMITRIPTYLINE 25 MG PO TAB
ORAL_TABLET | Freq: Every day | 3 refills | Status: AC
Start: 2020-06-30 — End: ?

## 2020-07-06 ENCOUNTER — Encounter: Admit: 2020-07-06 | Discharge: 2020-07-06 | Payer: MEDICARE

## 2020-07-06 MED ORDER — LISINOPRIL 40 MG PO TAB
ORAL_TABLET | Freq: Every day | 3 refills | Status: AC
Start: 2020-07-06 — End: ?

## 2020-08-12 ENCOUNTER — Encounter: Admit: 2020-08-12 | Discharge: 2020-08-12 | Payer: MEDICARE

## 2020-08-12 MED ORDER — DICYCLOMINE 10 MG PO CAP
10 mg | ORAL_CAPSULE | Freq: Two times a day (BID) | ORAL | 0 refills | Status: AC
Start: 2020-08-12 — End: ?

## 2020-08-20 ENCOUNTER — Encounter: Admit: 2020-08-20 | Discharge: 2020-08-20 | Payer: MEDICARE

## 2020-08-20 MED ORDER — ATORVASTATIN 80 MG PO TAB
ORAL_TABLET | Freq: Every evening | 2 refills | Status: AC
Start: 2020-08-20 — End: ?

## 2020-08-20 MED ORDER — EZETIMIBE 10 MG PO TAB
ORAL_TABLET | Freq: Every day | 2 refills | Status: AC
Start: 2020-08-20 — End: ?

## 2020-09-12 ENCOUNTER — Encounter: Admit: 2020-09-12 | Discharge: 2020-09-12 | Payer: MEDICARE

## 2020-09-12 MED ORDER — CELECOXIB 200 MG PO CAP
ORAL_CAPSULE | Freq: Every day | 3 refills | Status: AC
Start: 2020-09-12 — End: ?

## 2020-10-11 ENCOUNTER — Encounter: Admit: 2020-10-11 | Discharge: 2020-10-11 | Payer: MEDICARE

## 2020-10-11 NOTE — Telephone Encounter
Pt LVM requesting antibiotics for sinus infection.

## 2020-10-11 NOTE — Telephone Encounter
Reason for Disposition   Lots of coughing    Answer Assessment - Initial Assessment Questions  Pt called regarding ongoing sinus pressure, pain. Pt reports has had issue for over a week and at home tx have not really helped. Pt reports cough, headache, and sore throat. Pt also reports previous surgeries and concerned about infections. No appts avaliable until Wednesday. Pt advised to go to Urgent Care for further tx. Pt reports she will go to UC today.    1. LOCATION: "Where does it hurt?"       Headache - under eyes and above eyebrows  2. ONSET: "When did the sinus pain start?"  (e.g., hours, days)       A week  3. SEVERITY: "How bad is the pain?"   (Scale 1-10; mild, moderate or severe)    - MILD (1-3): doesn't interfere with normal activities     - MODERATE (4-7): interferes with normal activities (e.g., work or school) or awakens from sleep    - SEVERE (8-10): excruciating pain and patient unable to do any normal activities         7 to 8 pain  4. RECURRENT SYMPTOM: "Have you ever had sinus problems before?" If Yes, ask: "When was the last time?" and "What happened that time?"       Yes - last winter  5. NASAL CONGESTION: "Is the nose blocked?" If Yes, ask: "Can you open it or must you breathe through the mouth?"      Can breathe through both nostrils  6. NASAL DISCHARGE: "Do you have discharge from your nose?" If so ask, "What color?"      No  7. FEVER: "Do you have a fever?" If Yes, ask: "What is it, how was it measured, and when did it start?"       No  8. OTHER SYMPTOMS: "Do you have any other symptoms?" (e.g., sore throat, cough, earache, difficulty breathing)      Sore throat, cough (awaking up coughing - green sputum)  9. PREGNANCY: "Is there any chance you are pregnant?" "When was your last menstrual period?"      No    Protocols used: SINUS PAIN OR CONGESTION-A-OH

## 2020-10-18 ENCOUNTER — Encounter: Admit: 2020-10-18 | Discharge: 2020-10-18 | Payer: MEDICARE

## 2020-10-18 ENCOUNTER — Ambulatory Visit: Admit: 2020-10-18 | Discharge: 2020-10-18 | Payer: MEDICARE

## 2020-10-18 DIAGNOSIS — R42 Dizziness and giddiness: Secondary | ICD-10-CM

## 2020-10-18 DIAGNOSIS — K219 Gastro-esophageal reflux disease without esophagitis: Secondary | ICD-10-CM

## 2020-10-18 DIAGNOSIS — N390 Urinary tract infection, site not specified: Secondary | ICD-10-CM

## 2020-10-18 DIAGNOSIS — M1990 Unspecified osteoarthritis, unspecified site: Secondary | ICD-10-CM

## 2020-10-18 DIAGNOSIS — E039 Hypothyroidism, unspecified: Secondary | ICD-10-CM

## 2020-10-18 DIAGNOSIS — S30860A Insect bite (nonvenomous) of lower back and pelvis, initial encounter: Secondary | ICD-10-CM

## 2020-10-18 DIAGNOSIS — E785 Hyperlipidemia, unspecified: Secondary | ICD-10-CM

## 2020-10-18 DIAGNOSIS — G43909 Migraine, unspecified, not intractable, without status migrainosus: Secondary | ICD-10-CM

## 2020-10-18 DIAGNOSIS — R21 Rash and other nonspecific skin eruption: Secondary | ICD-10-CM

## 2020-10-18 DIAGNOSIS — F32A Depression: Secondary | ICD-10-CM

## 2020-10-18 DIAGNOSIS — J302 Other seasonal allergic rhinitis: Secondary | ICD-10-CM

## 2020-10-18 DIAGNOSIS — K289 Gastrojejunal ulcer, unspecified as acute or chronic, without hemorrhage or perforation: Secondary | ICD-10-CM

## 2020-10-18 MED ORDER — DOXYCYCLINE HYCLATE 100 MG PO TAB
100 mg | ORAL_TABLET | Freq: Two times a day (BID) | ORAL | 0 refills | 8.00000 days | Status: AC
Start: 2020-10-18 — End: ?

## 2020-10-18 NOTE — Patient Instructions
Doxycycline (an oral antibiotic) has been prescribed to treat/decrease risk of Lyme disease.    Follow-up with your primary care provider should you not have an improvement in symptoms with the oral antibiotic.    It is recommended that you go to the emergency department should you develop any sudden and/or severe worsening of your current symptoms, or any sudden and/or severe onset of new symptoms.    Seek immediate medical evaluation at the emergency department if you develop shortness of breath, chest pain, fever of 103.5 F or greater, or decrease in your level of consciousness.

## 2020-10-18 NOTE — Progress Notes
Date of Service: 10/18/2020    Tonya Williamson is a 75 y.o. female.  DOB: 1945/05/20  MRN: [id number]     Subjective:             History of Present Illness  Patient presents to urgent care with concern(s) of tick bite and rash.  HPI is as follows:    On 10/12/20 noticed a tick impeded in right side anterior groin area.  Does not know how long it was in place.  Placed rubbing alcohol on the tick and states it released on its own.  Examined the tick and states it was all intact.      On 10/16/20-10/17/20 reports noticing redness and bruising along with continued itching in area of tick bite.   Reports developing joint discomfort and body aches on 10/15/20 and states they have continue and progressed.    Denies fever, chills, sweats.    Last Tdap was in 2016.         Review of Systems   Constitutional: Negative for chills, diaphoresis and fever.   Respiratory: Negative.    Cardiovascular: Negative for chest pain and palpitations.   Musculoskeletal: Positive for arthralgias and myalgias.   Skin: Positive for rash and wound.         Objective:         ? amitriptyline (ELAVIL) 25 mg tablet TAKE 1 TABLET BY MOUTH DAILY at bedtime   ? amLODIPine (NORVASC) 10 mg tablet Take one tablet by mouth daily.   ? atorvastatin (LIPITOR) 80 mg tablet take one-half tablet BY MOUTH EVERY EVENING   ? benzonatate (TESSALON PERLES) 100 mg capsule Take two capsules by mouth every 8 hours as needed for Cough.   ? CALCIUM CARBONATE/VITAMIN D3 (CALCIUM + D PO) Take 1 tablet by mouth daily.   ? celecoxib (CELEBREX) 200 mg capsule TAKE 1 CAPSULE DAILY   ? dicyclomine (BENTYL) 10 mg capsule take one capsule by mouth twice daily   ? duloxetine DR (CYMBALTA) 60 mg capsule Take one capsule by mouth daily.   ? esomeprazole DR (NEXIUM) 40 mg capsule TAKE 1 CAPSULE TWICE DAILY ON AN EMPTY STOMACH AT     LEAST 1 HOUR BEFORE OR 2   HOURS AFTER FOOD   ? estradioL (ESTRACE) 0.5 mg tablet take 1 & 1/2 tablets BY MOUTH DAILY   ? ezetimibe (ZETIA) 10 mg tablet take one tablet by mouth once daily   ? famotidine (PEPCID) 40 mg tablet TAKE ONE TABLET BY MOUTH DAILY   ? fluticasone propionate (FLONASE) 50 mcg/actuation nasal spray, suspension USE 2 SPRAYS in each nostril AS DIRECTED DAILY. shake BEFORE USE   ? levothyroxine (SYNTHROID) 150 mcg tablet TAKE ONE TABLET BY MOUTH DAILY 30 MINUTES BEFORE BREAKFAST.   ? lidocaine viscous 2 % solution Take 15 mL by mouth every 3-4 hours as needed.   ? lisinopriL (ZESTRIL) 40 mg tablet TAKE 1 TABLET BY MOUTH DAILY   ? methocarbamoL (ROBAXIN) 500 mg tablet TAKE ONE TABLET TO TWO TABLETS BY MOUTH FOUR TIMES DAILY AS NEEDED FOR SPASMS.   ? MULTIVITAMIN PO Take 1 tablet by mouth daily.   ? MYRBETRIQ 25 mg tablet TAKE 1 TABLET DAILY   ? VASCEPA 1 gram capsule take 2 capsules BY MOUTH TWICE DAILY with meals     Vitals:    10/18/20 1004   BP: (!) 156/82   BP Source: Arm, Left Upper   Pulse: 64   Temp: 36.7 ?C (98.1 ?F)  Resp: 16   SpO2: 98%   TempSrc: Oral   Weight: 72.2 kg (159 lb 3.2 oz)   Height: 166.4 cm (5' 5.5)     Body mass index is 26.09 kg/m?Marland Kitchen     Physical Exam  Vitals reviewed.   Constitutional:       General: She is not in acute distress.     Appearance: Normal appearance. She is not ill-appearing, toxic-appearing or diaphoretic.   Cardiovascular:      Rate and Rhythm: Normal rate and regular rhythm.   Pulmonary:      Breath sounds: Normal breath sounds.   Skin:            Comments: Tick bite mark of right groin surrounded by redness and bruising.    Neurological:      Mental Status: She is alert.              Assessment and Plan:  Tonya Williamson was seen today for tick bite.    Diagnoses and all orders for this visit:    Tick bite of pelvic region, initial encounter    Rash of groin    Other orders  -     doxycycline hyclate (VIBRACIN) 100 mg tablet; Take one tablet by mouth twice daily for 10 days.      Patient Instructions   Doxycycline (an oral antibiotic) has been prescribed to treat/decrease risk of Lyme disease.    Follow-up with your primary care provider should you not have an improvement in symptoms with the oral antibiotic.    It is recommended that you go to the emergency department should you develop any sudden and/or severe worsening of your current symptoms, or any sudden and/or severe onset of new symptoms.    Seek immediate medical evaluation at the emergency department if you develop shortness of breath, chest pain, fever of 103.5 ?F or greater, or decrease in your level of consciousness.

## 2020-10-27 ENCOUNTER — Ambulatory Visit: Admit: 2020-10-27 | Discharge: 2020-10-28 | Payer: MEDICARE

## 2020-10-27 ENCOUNTER — Encounter: Admit: 2020-10-27 | Discharge: 2020-10-27 | Payer: MEDICARE

## 2020-10-27 DIAGNOSIS — R42 Dizziness and giddiness: Secondary | ICD-10-CM

## 2020-10-27 DIAGNOSIS — J302 Other seasonal allergic rhinitis: Secondary | ICD-10-CM

## 2020-10-27 DIAGNOSIS — M1990 Unspecified osteoarthritis, unspecified site: Secondary | ICD-10-CM

## 2020-10-27 DIAGNOSIS — R079 Chest pain, unspecified: Secondary | ICD-10-CM

## 2020-10-27 DIAGNOSIS — N390 Urinary tract infection, site not specified: Secondary | ICD-10-CM

## 2020-10-27 DIAGNOSIS — R931 Abnormal findings on diagnostic imaging of heart and coronary circulation: Secondary | ICD-10-CM

## 2020-10-27 DIAGNOSIS — G43909 Migraine, unspecified, not intractable, without status migrainosus: Secondary | ICD-10-CM

## 2020-10-27 DIAGNOSIS — I10 Essential (primary) hypertension: Secondary | ICD-10-CM

## 2020-10-27 DIAGNOSIS — E782 Mixed hyperlipidemia: Secondary | ICD-10-CM

## 2020-10-27 DIAGNOSIS — K289 Gastrojejunal ulcer, unspecified as acute or chronic, without hemorrhage or perforation: Secondary | ICD-10-CM

## 2020-10-27 DIAGNOSIS — E785 Hyperlipidemia, unspecified: Secondary | ICD-10-CM

## 2020-10-27 DIAGNOSIS — E039 Hypothyroidism, unspecified: Secondary | ICD-10-CM

## 2020-10-27 DIAGNOSIS — K219 Gastro-esophageal reflux disease without esophagitis: Secondary | ICD-10-CM

## 2020-10-27 DIAGNOSIS — F32A Depression: Secondary | ICD-10-CM

## 2020-10-27 MED ORDER — ICOSAPENT ETHYL 1 GRAM PO CAP
2 g | ORAL_CAPSULE | Freq: Two times a day (BID) | ORAL | 3 refills | Status: AC
Start: 2020-10-27 — End: ?

## 2020-10-27 NOTE — Patient Instructions
[facility] Nuclear Stress Test Instructions      [facility]M Procedures Scheduling Number: [id number]    The Thallium evaluation has two parts -- two nuclear scans.  The first scan is done in the morning and the second three to four hours later.   Wear comfortable clothing. Shorts or pants. (No dresses or skirts please).  Bring or wear sneakers/walking shoes if you are walking on [place].  Please let the nuclear technologists know if you plan on flying after the test.    NO CAFFEINE 24 HOURS PRIOR TO TEST. Examples: coffee, tea, decaf drinks, cola, chocolate.     DO NOT EAT OR DRINK THE MORNING OF YOUR TEST unless otherwise instructed. (You may have a couple sips of water).    If you are a diabetic, if insulin dependent: please take one third of your insulin with two pieces of dry toast and a small juice). Bring insulin and medication with you to the test.     TAKE MORNING MEDICATIONS WITH A COUPLE SIPS OF WATER PRIOR TO TEST.       WHAT TO DO BETWEEN THE FIRST TWO THALLIUM SCANS:  1. No strenuous exercise should be performed during this time.  2. A light lunch is permissible. The technologist will give you a list of appropriate foods.  3. Please return 15 minutes prior to the schedule of your second scan. Our nuclear technologist will tell you exactly what time to return.  4. Please do not use tobacco products in between scans.  5. After the first scan is completed, you may resume usual medications.     I have sent your Vascepa prescription to [facility] to see if it is any cheaper for you. Please let us know if it is or not, if not we will prescribe a different fish oil capsule.     FOLLOW UP APPOINTMENT:     Dr. Gwen Pounds would like to see you back in 6 months. Please call [phone] to cancel or reschedule your visit.     In order to provide you the best care possible we ask that you follow up as below:    For questions: please Dr. Kennis Carina nursing line at [phone] Monday - Friday 8-5 only.         Please leave a detailed message with your name, date of birth, and reason for your call.  A nurse will return your call as soon as possible.     For all medication refills: please contact your pharmacy.      Scheduling: Please call [phone]    It was truly our pleasure seeing you today. Thank you for choosing [facility] for your heart health!    Instructions given by Morrie Sheldon, RN

## 2020-10-27 NOTE — Telephone Encounter
Left voice mail for  patient regarding Dr. Afaq's recommendations from today's telehealth visit. Pt advised mychart message sent and nurse line phone number given for any further questions or concerns.

## 2020-10-27 NOTE — Progress Notes
Telehealth Visit Note    Date of Service: 10/27/2020    Subjective:      Obtained patient's verbal consent to treat them and their agreement to [facility] financial policy and NPP via this telehealth visit during the [facility] Emergency       Tonya Williamson is a 75 y.o. female.    History of Present Illness  75 year-old female with a history of hypertension hyperlipidemia hypothyroidism and coronary artery disease by elevated calcium score of 217 in 2019 returns for follow-up visit?via telehealth.    I last saw her in March prior to shoulder surgery.  Her shoulder surgery was completed successfully.    Last Friday she had an episode of back and rib cage pain which radiated to her left arm and neck.    She went to the emergency room.  She states that her ECG was normal and she was discharged.  We do not have a copy of those records.    Today she denies any chest pain or shortness of breath.  ?  In 2019 she?underwent a regadenoson thallium stress test which showed an ejection fraction of 79% and no evidence of myocardial ischemia.  ?  An echocardiogram also at that time revealed EF of 60 to 65% with mild tricuspid regurgitation. ?Estimated peak systolic PA pressure was 28 mmHg  ?  Her latest lipid profile shows an LDL of 48 and triglycerides of 121. ?She is on a atorvastatin Zetia and Vascepa.  ?  She is concerned about the cost of Vascepa.       Review of Systems   Constitutional: Negative.    HENT: Negative.    Eyes: Negative.    Respiratory: Negative.    Cardiovascular: Negative.    Gastrointestinal: Negative.    Endocrine: Negative.    Genitourinary: Negative.    Musculoskeletal: Positive for back pain and myalgias.        Patient stated that one week ago she experienced left-sided back pain that radiated to her left arm. She reports that her husband called EMS and an ECG was performed on site, and she was taken to [facility] in [place], [place]. Patient denies symptoms since that day. Allergic/Immunologic: Negative.    Hematological: Negative.    Psychiatric/Behavioral: Negative.          Objective:         ? amitriptyline (ELAVIL) 25 mg tablet TAKE 1 TABLET BY MOUTH DAILY at bedtime   ? amLODIPine (NORVASC) 10 mg tablet Take one tablet by mouth daily.   ? atorvastatin (LIPITOR) 80 mg tablet take one-half tablet BY MOUTH EVERY EVENING   ? benzonatate (TESSALON PERLES) 100 mg capsule Take two capsules by mouth every 8 hours as needed for Cough.   ? CALCIUM CARBONATE/VITAMIN D3 (CALCIUM + D PO) Take 1 tablet by mouth daily.   ? celecoxib (CELEBREX) 200 mg capsule TAKE 1 CAPSULE DAILY   ? dicyclomine (BENTYL) 10 mg capsule take one capsule by mouth twice daily   ? doxycycline hyclate (VIBRACIN) 100 mg tablet Take one tablet by mouth twice daily for 10 days.   ? duloxetine DR (CYMBALTA) 60 mg capsule Take one capsule by mouth daily.   ? esomeprazole DR (NEXIUM) 40 mg capsule TAKE 1 CAPSULE TWICE DAILY ON AN EMPTY STOMACH AT     LEAST 1 HOUR BEFORE OR 2   HOURS AFTER FOOD   ? estradioL (ESTRACE) 0.5 mg tablet take 1 & 1/2 tablets BY MOUTH DAILY   ?  ezetimibe (ZETIA) 10 mg tablet take one tablet by mouth once daily   ? famotidine (PEPCID) 40 mg tablet TAKE ONE TABLET BY MOUTH DAILY   ? fluticasone propionate (FLONASE) 50 mcg/actuation nasal spray, suspension USE 2 SPRAYS in each nostril AS DIRECTED DAILY. shake BEFORE USE   ? levothyroxine (SYNTHROID) 150 mcg tablet TAKE ONE TABLET BY MOUTH DAILY 30 MINUTES BEFORE BREAKFAST.   ? lisinopriL (ZESTRIL) 40 mg tablet TAKE 1 TABLET BY MOUTH DAILY   ? methocarbamoL (ROBAXIN) 500 mg tablet TAKE ONE TABLET TO TWO TABLETS BY MOUTH FOUR TIMES DAILY AS NEEDED FOR SPASMS.   ? MULTIVITAMIN PO Take 1 tablet by mouth daily.   ? MYRBETRIQ 25 mg tablet TAKE 1 TABLET DAILY   ? VASCEPA 1 gram capsule take 2 capsules BY MOUTH TWICE DAILY with meals          Telehealth Patient Reported Vitals     Row Name 10/27/20 0920                BP: 148/94        BP Source: Arm, Left Upper        BP Position: Sitting        BP Method: Automatic/Digital Reading        Pulse: 70        Weight: 68 kg (150 lb)        Height: 166.4 cm (5' 5.5)        SpO2: --  Unable to assess        Pain Score: Zero                  Telehealth Body Mass Index: 24.58 at 10/27/2020 10:20 AM    Physical Exam  Comfortable at rest  No distress         Assessment and Plan:  Elevated coronary calcium score  Essential hypertension  Mixed hyperlipidemia  Precordial chest pain                Tonya Williamson?has a history of hypertension hyperlipidemia and elevated calcium score/coronary artery disease. ?    She had an episode of rib and chest and back discomfort radiating to her arm and neck.  This was of unclear etiology.    I am recommending we obtain a nuclear stress test to evaluate for ischemia.  ?  Her lipids are well controlled.  She will continue on her lipid-lowering therapy as prescribed.  We will see if we can send her prescription of Vascepa to [facility] as per her request to help with cost.  ?  She should continue with a heart healthy diet and regular exercise.??  ?  I appreciate the opportunity of seeing?Tonya.?Williamson?via telehealth today.??She will return for follow-up with me in 6 months.             35-40 minutes spent on this patient's encounter with counseling and coordination of care taking >50% of the visit.

## 2020-10-27 NOTE — Telephone Encounter
Spoke with patient regarding Dr. Afaq's recommendations from today's telehealth visit. All questions answered and pt verbalized understanding of all information given. Pt verbalized agreement with plan of care.

## 2020-10-28 ENCOUNTER — Encounter: Admit: 2020-10-28 | Discharge: 2020-10-28 | Payer: MEDICARE

## 2020-10-28 ENCOUNTER — Ambulatory Visit: Admit: 2020-10-28 | Discharge: 2020-10-28 | Payer: MEDICARE

## 2020-10-28 DIAGNOSIS — I10 Essential (primary) hypertension: Secondary | ICD-10-CM

## 2020-10-28 DIAGNOSIS — G43909 Migraine, unspecified, not intractable, without status migrainosus: Secondary | ICD-10-CM

## 2020-10-28 DIAGNOSIS — E785 Hyperlipidemia, unspecified: Secondary | ICD-10-CM

## 2020-10-28 DIAGNOSIS — Z1211 Encounter for screening for malignant neoplasm of colon: Secondary | ICD-10-CM

## 2020-10-28 DIAGNOSIS — N390 Urinary tract infection, site not specified: Secondary | ICD-10-CM

## 2020-10-28 DIAGNOSIS — J302 Other seasonal allergic rhinitis: Secondary | ICD-10-CM

## 2020-10-28 DIAGNOSIS — R6884 Jaw pain: Secondary | ICD-10-CM

## 2020-10-28 DIAGNOSIS — K289 Gastrojejunal ulcer, unspecified as acute or chronic, without hemorrhage or perforation: Secondary | ICD-10-CM

## 2020-10-28 DIAGNOSIS — F32A Depression: Secondary | ICD-10-CM

## 2020-10-28 DIAGNOSIS — K219 Gastro-esophageal reflux disease without esophagitis: Secondary | ICD-10-CM

## 2020-10-28 DIAGNOSIS — E039 Hypothyroidism, unspecified: Secondary | ICD-10-CM

## 2020-10-28 DIAGNOSIS — R42 Dizziness and giddiness: Secondary | ICD-10-CM

## 2020-10-28 DIAGNOSIS — M1990 Unspecified osteoarthritis, unspecified site: Secondary | ICD-10-CM

## 2020-10-28 NOTE — Patient Instructions
Colonoscopy -[phone] or [phone]

## 2020-10-28 NOTE — Progress Notes
Date of Service: 10/28/2020    Tonya Williamson is a 75 y.o. female. DOB: Dec 25, 1945   MRN#: [id number]    Subjective:       75 yo female here for a recent ER visit to [facility] in [place] - had pain in her back- left arm pain - jaw pain - sxs started on 10-22-20- her husband called 911- pt chewed 4 baby asa - the pain got better- lasted for about 45 minutes- bp was high when EMS came- had troponins- an EKG and cxr- had a telehealth visit with Dr. Ave Filter a chemical stress test next week  Reviewed the  ED records- will have scanned into her chart  Hx of an elevated coronary artery calcium score- shoulder and neck pain- htn- hyperlipidemia-GERD-hypothyroidism- OAB  Reviewed hx in epic briefly    Patient Reported Other  What medical problem brings you in to see the provider? Had pain in my left back area and down my left arm, then the pain went to my jaw line.  My husband called ems and they took me to [facility] in [place], [place].  They ran blood work and took a chest x-ray.  The first blood test showed negative results so after 2 hrs they ran the same blood test and it came back negative, they told me to see my primary doctor.. This is a new problem. The current episode started in the past 7 days. The problem occurs rarely. The problem has been rapidly improving. The pain score is: 8/10. The pain severity is severe.  Associated symptoms include arthralgias and headaches. Pertinent negatives include no abdominal pain, anorexia, change in bowel habit, chills, congestion, coughing, diaphoresis, fatigue, fever, joint swelling, myalgias, nausea, neck pain, numbness, rash, sore throat, swollen glands, urinary symptoms, vertigo, visual change, vomiting or weakness. The treatment provided significant relief.            Review of Systems   Constitutional: Negative for chills, diaphoresis, fatigue and fever.   HENT: Negative for congestion and sore throat.    Cardiovascular:        Did not have chest pain Respiratory: Negative for cough and shortness of breath.    Skin: Negative for rash.   Musculoskeletal: Positive for arthralgias. Negative for joint swelling, myalgias and neck pain.   Gastrointestinal: Negative for abdominal pain, anorexia, change in bowel habit, nausea and vomiting.   Neurological: Positive for headaches. Negative for numbness, vertigo and weakness.         Objective:     ? amitriptyline (ELAVIL) 25 mg tablet TAKE 1 TABLET BY MOUTH DAILY at bedtime   ? amLODIPine (NORVASC) 10 mg tablet Take one tablet by mouth daily.   ? atorvastatin (LIPITOR) 80 mg tablet take one-half tablet BY MOUTH EVERY EVENING   ? benzonatate (TESSALON PERLES) 100 mg capsule Take two capsules by mouth every 8 hours as needed for Cough.   ? CALCIUM CARBONATE/VITAMIN D3 (CALCIUM + D PO) Take 1 tablet by mouth daily.   ? celecoxib (CELEBREX) 200 mg capsule TAKE 1 CAPSULE DAILY   ? dicyclomine (BENTYL) 10 mg capsule take one capsule by mouth twice daily   ? doxycycline hyclate (VIBRACIN) 100 mg tablet Take one tablet by mouth twice daily for 10 days.   ? duloxetine DR (CYMBALTA) 60 mg capsule Take one capsule by mouth daily.   ? esomeprazole DR (NEXIUM) 40 mg capsule TAKE 1 CAPSULE TWICE DAILY ON AN EMPTY STOMACH AT     LEAST 1  HOUR BEFORE OR 2   HOURS AFTER FOOD   ? estradioL (ESTRACE) 0.5 mg tablet take 1 & 1/2 tablets BY MOUTH DAILY   ? ezetimibe (ZETIA) 10 mg tablet take one tablet by mouth once daily   ? famotidine (PEPCID) 40 mg tablet TAKE ONE TABLET BY MOUTH DAILY   ? fluticasone propionate (FLONASE) 50 mcg/actuation nasal spray, suspension USE 2 SPRAYS in each nostril AS DIRECTED DAILY. shake BEFORE USE   ? icosapent ethyL (VASCEPA) 1 gram capsule Take two capsules by mouth twice daily with meals.   ? levothyroxine (SYNTHROID) 150 mcg tablet TAKE ONE TABLET BY MOUTH DAILY 30 MINUTES BEFORE BREAKFAST.   ? lisinopriL (ZESTRIL) 40 mg tablet TAKE 1 TABLET BY MOUTH DAILY   ? methocarbamoL (ROBAXIN) 500 mg tablet TAKE ONE TABLET TO TWO TABLETS BY MOUTH FOUR TIMES DAILY AS NEEDED FOR SPASMS.   ? MULTIVITAMIN PO Take 1 tablet by mouth daily.   ? MYRBETRIQ 25 mg tablet TAKE 1 TABLET DAILY     Vitals:    10/28/20 1535   BP: (!) 148/78   BP Source: Arm, Left Upper   Pulse: 77   Temp: 36.4 ?C (97.6 ?F)   SpO2: 97%   TempSrc: Temporal   PainSc: Six   Weight: 72.1 kg (159 lb)   Height: 166.4 cm (5' 5.51)     Body mass index is 26.05 kg/m?Marland Kitchen     Physical Exam  Vitals and nursing note reviewed.   Constitutional:       Appearance: Normal appearance. She is well-developed.      Comments: bp was 140/80 on recheck   HENT:      Head: Normocephalic and atraumatic.      Right Ear: Tympanic membrane and ear canal normal.      Left Ear: Tympanic membrane and ear canal normal.      Mouth/Throat:      Mouth: Mucous membranes are moist.      Pharynx: Oropharynx is clear.   Eyes:      Conjunctiva/sclera: Conjunctivae normal.      Pupils: Pupils are equal, round, and reactive to light.   Neck:      Thyroid: No thyromegaly.   Cardiovascular:      Rate and Rhythm: Normal rate and regular rhythm.      Heart sounds: No murmur heard.  Pulmonary:      Effort: Pulmonary effort is normal.      Breath sounds: Normal breath sounds.   Abdominal:      General: Bowel sounds are normal. There is no distension.      Palpations: Abdomen is soft. There is no mass.      Tenderness: There is no abdominal tenderness.   Musculoskeletal:      Cervical back: Neck supple.      Right lower leg: No edema.      Left lower leg: No edema.   Lymphadenopathy:      Cervical: No cervical adenopathy.   Skin:     General: Skin is warm and dry.   Neurological:      General: No focal deficit present.      Mental Status: She is alert and oriented to person, place, and time.   Psychiatric:         Mood and Affect: Mood normal.         Behavior: Behavior normal.         Thought Content: Thought content normal.  Judgment: Judgment normal.           A/p  Jaw pain- sxs have resolved- visited with her cardiologist yesterday- having a stress test next week  Her colonoscopy is due this yr- ordered- will have scheduled if the stress test is not worrisome  htn- on norvasc and lisinopril- bp was 140/80 - high in the ED and on 10-18-20- will message and cardiology and see which med they would like to add- toprol xl? Coreg? 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

## 2020-10-29 ENCOUNTER — Encounter: Admit: 2020-10-29 | Discharge: 2020-10-29 | Payer: MEDICARE

## 2020-10-29 NOTE — Progress Notes
Records request faxed to [facility] in [facility] for most recent ED visit.

## 2020-11-01 ENCOUNTER — Encounter: Admit: 2020-11-01 | Discharge: 2020-11-01 | Payer: MEDICARE

## 2020-11-01 MED ORDER — CARVEDILOL 3.125 MG PO TAB
3.125 mg | ORAL_TABLET | Freq: Two times a day (BID) | ORAL | 3 refills | 90.00000 days | Status: AC
Start: 2020-11-01 — End: ?

## 2020-11-02 ENCOUNTER — Encounter: Admit: 2020-11-02 | Discharge: 2020-11-02 | Payer: MEDICARE

## 2020-11-02 ENCOUNTER — Ambulatory Visit: Admit: 2020-11-02 | Discharge: 2020-11-02 | Payer: MEDICARE

## 2020-11-02 DIAGNOSIS — R079 Chest pain, unspecified: Secondary | ICD-10-CM

## 2020-11-02 MED ORDER — RP DX TC-99M TETROFOSMIN MCI
12 | Freq: Once | INTRAVENOUS | 0 refills | Status: CP
Start: 2020-11-02 — End: ?
  Administered 2020-11-02: 16:00:00 12.02 via INTRAVENOUS

## 2020-11-02 MED ORDER — REGADENOSON 0.4 MG/5 ML IV SYRG
.4 mg | Freq: Once | INTRAVENOUS | 0 refills | Status: CP
Start: 2020-11-02 — End: ?
  Administered 2020-11-02: 16:00:00 0.4 mg via INTRAVENOUS

## 2020-11-02 MED ORDER — NITROGLYCERIN 0.4 MG SL SUBL
.4 mg | SUBLINGUAL | 0 refills | Status: AC | PRN
Start: 2020-11-02 — End: ?

## 2020-11-02 MED ORDER — EUCALYPTUS-MENTHOL MM LOZG
1 | Freq: Once | ORAL | 0 refills | Status: AC | PRN
Start: 2020-11-02 — End: ?

## 2020-11-02 MED ORDER — RP DX TC-99M TETROFOSMIN MCI
4 | Freq: Once | INTRAVENOUS | 0 refills | Status: CP
Start: 2020-11-02 — End: ?
  Administered 2020-11-02: 15:00:00 4 via INTRAVENOUS

## 2020-11-02 MED ORDER — AMINOPHYLLINE 500 MG/20 ML IV SOLN
50 mg | INTRAVENOUS | 0 refills | Status: AC | PRN
Start: 2020-11-02 — End: ?

## 2020-11-02 MED ORDER — SODIUM CHLORIDE 0.9 % IV SOLP
250 mL | INTRAVENOUS | 0 refills | Status: AC | PRN
Start: 2020-11-02 — End: ?

## 2020-11-02 MED ORDER — ALBUTEROL SULFATE 90 MCG/ACTUATION IN HFAA
2 | RESPIRATORY_TRACT | 0 refills | Status: AC | PRN
Start: 2020-11-02 — End: ?

## 2020-11-02 NOTE — Telephone Encounter
I messaged her cardiologist about her bp- he thought she could try adding coreg to her norvasc and lisinopril after she is done with her stress test  Sent the rx to [facility]  Please let us know her bp readings in 2 weeks if has a home cuff- otherwise please make a follow- up visit in a few weeks to recheck her bp

## 2020-11-03 ENCOUNTER — Encounter: Admit: 2020-11-03 | Discharge: 2020-11-03 | Payer: MEDICARE

## 2020-11-03 DIAGNOSIS — G43909 Migraine, unspecified, not intractable, without status migrainosus: Secondary | ICD-10-CM

## 2020-11-03 DIAGNOSIS — K219 Gastro-esophageal reflux disease without esophagitis: Secondary | ICD-10-CM

## 2020-11-03 DIAGNOSIS — M1990 Unspecified osteoarthritis, unspecified site: Secondary | ICD-10-CM

## 2020-11-03 DIAGNOSIS — K289 Gastrojejunal ulcer, unspecified as acute or chronic, without hemorrhage or perforation: Secondary | ICD-10-CM

## 2020-11-03 DIAGNOSIS — E785 Hyperlipidemia, unspecified: Secondary | ICD-10-CM

## 2020-11-03 DIAGNOSIS — E039 Hypothyroidism, unspecified: Secondary | ICD-10-CM

## 2020-11-03 DIAGNOSIS — N390 Urinary tract infection, site not specified: Secondary | ICD-10-CM

## 2020-11-03 DIAGNOSIS — F32A Depression: Secondary | ICD-10-CM

## 2020-11-03 DIAGNOSIS — J302 Other seasonal allergic rhinitis: Secondary | ICD-10-CM

## 2020-11-03 DIAGNOSIS — R42 Dizziness and giddiness: Secondary | ICD-10-CM

## 2020-11-10 ENCOUNTER — Encounter: Admit: 2020-11-10 | Discharge: 2020-11-10 | Payer: MEDICARE

## 2020-11-10 DIAGNOSIS — E039 Hypothyroidism, unspecified: Secondary | ICD-10-CM

## 2020-11-10 MED ORDER — LEVOTHYROXINE 150 MCG PO TAB
150 ug | ORAL_TABLET | Freq: Every day | ORAL | 1 refills | 30.00000 days | Status: AC
Start: 2020-11-10 — End: ?

## 2020-11-12 ENCOUNTER — Ambulatory Visit: Admit: 2020-11-12 | Discharge: 2020-11-12 | Payer: MEDICARE

## 2020-11-12 DIAGNOSIS — Z1211 Encounter for screening for malignant neoplasm of colon: Secondary | ICD-10-CM

## 2020-11-21 ENCOUNTER — Encounter: Admit: 2020-11-21 | Discharge: 2020-11-21 | Payer: MEDICARE

## 2020-11-21 MED ORDER — DICYCLOMINE 10 MG PO CAP
10 mg | ORAL_CAPSULE | Freq: Two times a day (BID) | ORAL | 3 refills | Status: AC
Start: 2020-11-21 — End: ?

## 2020-11-25 ENCOUNTER — Encounter: Admit: 2020-11-25 | Discharge: 2020-11-25 | Payer: MEDICARE

## 2020-11-25 ENCOUNTER — Ambulatory Visit: Admit: 2020-11-25 | Discharge: 2020-11-25 | Payer: MEDICARE

## 2020-11-25 DIAGNOSIS — N390 Urinary tract infection, site not specified: Secondary | ICD-10-CM

## 2020-11-25 DIAGNOSIS — R300 Dysuria: Secondary | ICD-10-CM

## 2020-11-25 DIAGNOSIS — M1990 Unspecified osteoarthritis, unspecified site: Secondary | ICD-10-CM

## 2020-11-25 DIAGNOSIS — K219 Gastro-esophageal reflux disease without esophagitis: Secondary | ICD-10-CM

## 2020-11-25 DIAGNOSIS — J302 Other seasonal allergic rhinitis: Secondary | ICD-10-CM

## 2020-11-25 DIAGNOSIS — E785 Hyperlipidemia, unspecified: Secondary | ICD-10-CM

## 2020-11-25 DIAGNOSIS — I10 Essential (primary) hypertension: Secondary | ICD-10-CM

## 2020-11-25 DIAGNOSIS — K289 Gastrojejunal ulcer, unspecified as acute or chronic, without hemorrhage or perforation: Secondary | ICD-10-CM

## 2020-11-25 DIAGNOSIS — F32A Depression: Secondary | ICD-10-CM

## 2020-11-25 DIAGNOSIS — G43909 Migraine, unspecified, not intractable, without status migrainosus: Secondary | ICD-10-CM

## 2020-11-25 DIAGNOSIS — R42 Dizziness and giddiness: Secondary | ICD-10-CM

## 2020-11-25 DIAGNOSIS — E039 Hypothyroidism, unspecified: Secondary | ICD-10-CM

## 2020-11-25 LAB — URINALYSIS DIPSTICK
GLUCOSE,UA: NEGATIVE
NITRITE: POSITIVE — AB
URINE ASCORBIC ACID, UA: NEGATIVE
URINE BILE: NEGATIVE
URINE KETONE: NEGATIVE
URINE PH: 5 (ref 5.0–8.0)
URINE SPEC GRAVITY: 1 (ref 1.005–1.030)

## 2020-11-25 LAB — URINALYSIS, MICROSCOPIC

## 2020-11-25 MED ORDER — CEFDINIR 300 MG PO CAP
300 mg | ORAL_CAPSULE | Freq: Two times a day (BID) | ORAL | 0 refills | 8.00000 days | Status: AC
Start: 2020-11-25 — End: ?

## 2020-11-25 NOTE — Telephone Encounter
sure

## 2020-11-25 NOTE — Telephone Encounter
There are 2 opening this afternoon

## 2020-11-25 NOTE — Progress Notes
Date of Service: 11/25/2020    Tonya Williamson is a 75 y.o. female. DOB: Sep 30, 1945   MRN#: [id number]    Subjective:       75 yo female had some mild lower abd discomfort late afternoon yesterday -this am was worse  Had her flu shot yesterday  Had diarrhea x 2 today- not had recently before today  Having pain with urination-describes as a stinging sensation  Reviewed hx in epic briefly  Hx of htn-hyperlipidemia-GERD- hypothyroidism           Review of Systems   Constitutional: Negative for chills, diaphoresis and fever.   HENT:        No recent URI sxs   Cardiovascular: Negative for chest pain.   Respiratory: Negative for cough and shortness of breath.    Musculoskeletal: Positive for back pain.   Gastrointestinal: Positive for diarrhea. Negative for hematochezia, melena, nausea and vomiting.   Genitourinary: Positive for dysuria, frequency, pelvic pain and urgency. Negative for hematuria.        Her urine was darker than usual  Trying to drink a lot of fluid           Objective:     ? amitriptyline (ELAVIL) 25 mg tablet TAKE 1 TABLET BY MOUTH DAILY at bedtime   ? amLODIPine (NORVASC) 10 mg tablet Take one tablet by mouth daily.   ? atorvastatin (LIPITOR) 80 mg tablet take one-half tablet BY MOUTH EVERY EVENING   ? benzonatate (TESSALON PERLES) 100 mg capsule Take two capsules by mouth every 8 hours as needed for Cough.   ? CALCIUM CARBONATE/VITAMIN D3 (CALCIUM + D PO) Take 1 tablet by mouth daily.   ? carvediloL (COREG) 3.125 mg tablet Take one tablet by mouth twice daily with meals. Take with food.   ? celecoxib (CELEBREX) 200 mg capsule TAKE 1 CAPSULE DAILY   ? dicyclomine (BENTYL) 10 mg capsule take one capsule by mouth twice daily   ? duloxetine DR (CYMBALTA) 60 mg capsule Take one capsule by mouth daily.   ? esomeprazole DR (NEXIUM) 40 mg capsule TAKE 1 CAPSULE TWICE DAILY ON AN EMPTY STOMACH AT     LEAST 1 HOUR BEFORE OR 2   HOURS AFTER FOOD   ? estradioL (ESTRACE) 0.5 mg tablet take 1 & 1/2 tablets BY MOUTH DAILY   ? ezetimibe (ZETIA) 10 mg tablet take one tablet by mouth once daily   ? famotidine (PEPCID) 40 mg tablet TAKE ONE TABLET BY MOUTH DAILY   ? fluticasone propionate (FLONASE) 50 mcg/actuation nasal spray, suspension USE 2 SPRAYS in each nostril AS DIRECTED DAILY. shake BEFORE USE   ? icosapent ethyL (VASCEPA) 1 gram capsule Take two capsules by mouth twice daily with meals.   ? levothyroxine (SYNTHROID) 150 mcg tablet TAKE ONE TABLET BY MOUTH DAILY 30 MINUTES BEFORE BREAKFAST.   ? lisinopriL (ZESTRIL) 40 mg tablet TAKE 1 TABLET BY MOUTH DAILY   ? methocarbamoL (ROBAXIN) 500 mg tablet TAKE ONE TABLET TO TWO TABLETS BY MOUTH FOUR TIMES DAILY AS NEEDED FOR SPASMS.   ? MULTIVITAMIN PO Take 1 tablet by mouth daily.   ? MYRBETRIQ 25 mg tablet TAKE 1 TABLET DAILY     Vitals:    11/25/20 1541   BP: 124/70   BP Source: Arm, Left Upper   Pulse: 73   Temp: 36.7 ?C (98 ?F)   SpO2: 98%   TempSrc: Temporal   PainSc: Eight   Weight: 74.4 kg (164 lb)  Height: 166.4 cm (5' 5.51)     Body mass index is 26.87 kg/m?Marland Kitchen     Physical Exam  Vitals and nursing note reviewed.   Constitutional:       Appearance: Normal appearance. She is well-developed.      Comments: bp was 120/65-70 on recheck   HENT:      Head: Normocephalic and atraumatic.      Mouth/Throat:      Mouth: Mucous membranes are moist.      Pharynx: Oropharynx is clear.   Eyes:      Conjunctiva/sclera: Conjunctivae normal.      Pupils: Pupils are equal, round, and reactive to light.   Neck:      Thyroid: No thyromegaly.   Cardiovascular:      Rate and Rhythm: Normal rate and regular rhythm.      Heart sounds: No murmur heard.  Pulmonary:      Effort: Pulmonary effort is normal.      Breath sounds: Normal breath sounds.   Abdominal:      General: Bowel sounds are normal. There is no distension.      Palpations: Abdomen is soft. There is no mass.      Comments: Mld suprapubic tenderness   Musculoskeletal:      Cervical back: Neck supple.      Right lower leg: No edema.      Left lower leg: No edema.   Lymphadenopathy:      Cervical: No cervical adenopathy.   Skin:     General: Skin is warm and dry.   Neurological:      General: No focal deficit present.      Mental Status: She is alert and oriented to person, place, and time.   Psychiatric:         Mood and Affect: Mood normal.         Behavior: Behavior normal.         Thought Content: Thought content normal.         Judgment: Judgment normal.           A/p  Dysuria - ua c and s pending- the ua results should be available from the [facility] lab this evening-- ordered omnicef 300 mg po bid x 1 week empirically  htn- bp was 120/65- 70 on recheck -pt will check her bp with her cuff when she returns home to make sure her cuff is reading correctly-- cont norvasc 10 mg and lisinopril 40 mg per day- coreg 3.125 mg po bid   rtc prn

## 2020-11-25 NOTE — Telephone Encounter
Patient would like to see Dr. Reita Cliche states having pain urinating and pain in the lower abdomen.  If not able to be seen would like a urine test done.  Spoke with pt. Reports having pain above pubic bone/ bladder area and back. States had urgency, dribbling and painful urination this morning. Denies any fever. Drinking lots of water per pt.   LOV 10/28/20 Informed pt, Dr. Reita Cliche has no openings until next Wednesday. Notified pt lab order for ua & c/s placed. Pt will come to [facility] lab today. Would like Rx sent to [facility] in [place]. Informed patient, will forward message to Dr. Reita Cliche.  Basilio Cairo, RN

## 2020-11-28 ENCOUNTER — Encounter: Admit: 2020-11-28 | Discharge: 2020-11-28 | Payer: MEDICARE

## 2020-11-28 DIAGNOSIS — R3129 Other microscopic hematuria: Secondary | ICD-10-CM

## 2020-11-28 MED ORDER — NITROFURANTOIN MONOHYD/M-CRYST 100 MG PO CAP
100 mg | ORAL_CAPSULE | Freq: Two times a day (BID) | ORAL | 0 refills | 7.00000 days | Status: AC
Start: 2020-11-28 — End: ?

## 2020-12-13 ENCOUNTER — Encounter: Admit: 2020-12-13 | Discharge: 2020-12-13 | Payer: MEDICARE

## 2020-12-13 MED ORDER — ESOMEPRAZOLE MAGNESIUM 40 MG PO CPDR
ORAL_CAPSULE | Freq: Two times a day (BID) | 3 refills | 90.00000 days | Status: AC
Start: 2020-12-13 — End: ?

## 2021-01-31 ENCOUNTER — Encounter: Admit: 2021-01-31 | Discharge: 2021-01-31 | Payer: MEDICARE

## 2021-01-31 MED ORDER — CARVEDILOL 3.125 MG PO TAB
ORAL_TABLET | Freq: Two times a day (BID) | ORAL | 3 refills | 90.00000 days | Status: AC
Start: 2021-01-31 — End: ?

## 2021-02-03 ENCOUNTER — Encounter: Admit: 2021-02-03 | Discharge: 2021-02-03 | Payer: MEDICARE

## 2021-02-03 MED ORDER — AMOXICILLIN-POT CLAVULANATE 875-125 MG PO TAB
1 | ORAL_TABLET | Freq: Two times a day (BID) | ORAL | 0 refills | 7.00000 days | Status: AC
Start: 2021-02-03 — End: ?

## 2021-02-03 MED ORDER — FLUCONAZOLE 150 MG PO TAB
ORAL_TABLET | ORAL | 0 refills | 3.00000 days | Status: AC
Start: 2021-02-03 — End: ?

## 2021-02-03 NOTE — Telephone Encounter
Sent augmentin to [facility]

## 2021-02-03 NOTE — Telephone Encounter
2957- Patient LVM that she has a "sinus infection". She states she is coughing up "really dark green stuff" and wonders if Dr. Reita Cliche could call something in to the pharmacy in [place].

## 2021-02-03 NOTE — Telephone Encounter
Done! The [facility] rxs are going thru slowly - not received an electronic confirmation from them that they received the rxs

## 2021-02-03 NOTE — Telephone Encounter
Patient notified of Rx sent to [facility].  Patient asked if prescription for yeast infection was also sent, states gets yeast infection whenever patient takes abx.  Informed patient will forward message to Dr. Reita Cliche.  Patient states no need to call back, will get notification from pharmacy.  Basilio Cairo, RN

## 2021-02-04 ENCOUNTER — Encounter: Admit: 2021-02-04 | Discharge: 2021-02-04 | Payer: MEDICARE

## 2021-02-04 MED ORDER — DULOXETINE 60 MG PO CPDR
ORAL_CAPSULE | Freq: Every day | ORAL | 3 refills | 60.00000 days | Status: AC
Start: 2021-02-04 — End: ?

## 2021-02-04 MED ORDER — FAMOTIDINE 40 MG PO TAB
40 mg | ORAL_TABLET | Freq: Every day | ORAL | 3 refills | 90.00000 days | Status: AC
Start: 2021-02-04 — End: ?

## 2021-02-08 ENCOUNTER — Encounter: Admit: 2021-02-08 | Discharge: 2021-02-08 | Payer: MEDICARE

## 2021-02-08 DIAGNOSIS — E039 Hypothyroidism, unspecified: Secondary | ICD-10-CM

## 2021-02-08 MED ORDER — LEVOTHYROXINE 150 MCG PO TAB
150 ug | ORAL_TABLET | Freq: Every day | ORAL | 3 refills | 30.00000 days | Status: AC
Start: 2021-02-08 — End: ?

## 2021-02-08 MED ORDER — ESTRADIOL 0.5 MG PO TAB
ORAL_TABLET | Freq: Every day | 3 refills | 30.00000 days | Status: AC
Start: 2021-02-08 — End: ?

## 2021-02-08 MED ORDER — LISINOPRIL 40 MG PO TAB
ORAL_TABLET | Freq: Every day | 3 refills | Status: AC
Start: 2021-02-08 — End: ?

## 2021-02-08 MED ORDER — CARVEDILOL 3.125 MG PO TAB
ORAL_TABLET | Freq: Two times a day (BID) | ORAL | 3 refills | 90.00000 days | Status: AC
Start: 2021-02-08 — End: ?

## 2021-02-08 MED ORDER — EZETIMIBE 10 MG PO TAB
ORAL_TABLET | Freq: Every day | 3 refills | Status: AC
Start: 2021-02-08 — End: ?

## 2021-02-08 MED ORDER — AMITRIPTYLINE 25 MG PO TAB
ORAL_TABLET | Freq: Every day | 3 refills | Status: AC
Start: 2021-02-08 — End: ?

## 2021-02-08 MED ORDER — AMLODIPINE 10 MG PO TAB
ORAL_TABLET | Freq: Every day | 3 refills | Status: AC
Start: 2021-02-08 — End: ?

## 2021-03-07 ENCOUNTER — Encounter: Admit: 2021-03-07 | Discharge: 2021-03-07 | Payer: MEDICARE

## 2021-03-07 MED ORDER — DULOXETINE 60 MG PO CPDR
60 mg | ORAL_CAPSULE | Freq: Every day | ORAL | 0 refills | 60.00000 days | Status: AC
Start: 2021-03-07 — End: ?

## 2021-03-15 ENCOUNTER — Encounter: Admit: 2021-03-15 | Discharge: 2021-03-15 | Payer: MEDICARE

## 2021-03-15 MED ORDER — DULOXETINE 60 MG PO CPDR
ORAL_CAPSULE | Freq: Every day | ORAL | 1 refills | 60.00000 days | Status: AC
Start: 2021-03-15 — End: ?

## 2021-03-19 ENCOUNTER — Encounter: Admit: 2021-03-19 | Discharge: 2021-03-19 | Payer: MEDICARE

## 2021-03-19 DIAGNOSIS — N3941 Urge incontinence: Secondary | ICD-10-CM

## 2021-03-19 MED ORDER — MYRBETRIQ 25 MG PO TB24
ORAL_TABLET | Freq: Every day | 2 refills | Status: AC
Start: 2021-03-19 — End: ?

## 2021-03-19 MED ORDER — METHOCARBAMOL 500 MG PO TAB
500-1000 mg | ORAL_TABLET | Freq: Four times a day (QID) | ORAL | 5 refills | Status: AC | PRN
Start: 2021-03-19 — End: ?

## 2021-04-04 ENCOUNTER — Ambulatory Visit: Admit: 2021-04-04 | Discharge: 2021-04-04 | Payer: MEDICARE

## 2021-04-04 ENCOUNTER — Encounter: Admit: 2021-04-04 | Discharge: 2021-04-04 | Payer: MEDICARE

## 2021-04-04 DIAGNOSIS — Z1231 Encounter for screening mammogram for malignant neoplasm of breast: Secondary | ICD-10-CM

## 2021-04-05 ENCOUNTER — Encounter: Admit: 2021-04-05 | Discharge: 2021-04-05 | Payer: MEDICARE

## 2021-04-05 DIAGNOSIS — F32A Depression: Secondary | ICD-10-CM

## 2021-04-05 DIAGNOSIS — G43909 Migraine, unspecified, not intractable, without status migrainosus: Secondary | ICD-10-CM

## 2021-04-05 DIAGNOSIS — J302 Other seasonal allergic rhinitis: Secondary | ICD-10-CM

## 2021-04-05 DIAGNOSIS — R42 Dizziness and giddiness: Secondary | ICD-10-CM

## 2021-04-05 DIAGNOSIS — E039 Hypothyroidism, unspecified: Secondary | ICD-10-CM

## 2021-04-05 DIAGNOSIS — M1990 Unspecified osteoarthritis, unspecified site: Secondary | ICD-10-CM

## 2021-04-05 DIAGNOSIS — E785 Hyperlipidemia, unspecified: Secondary | ICD-10-CM

## 2021-04-05 DIAGNOSIS — K219 Gastro-esophageal reflux disease without esophagitis: Secondary | ICD-10-CM

## 2021-04-05 DIAGNOSIS — N390 Urinary tract infection, site not specified: Secondary | ICD-10-CM

## 2021-04-05 DIAGNOSIS — K289 Gastrojejunal ulcer, unspecified as acute or chronic, without hemorrhage or perforation: Secondary | ICD-10-CM

## 2021-04-12 ENCOUNTER — Encounter: Admit: 2021-04-12 | Discharge: 2021-04-12 | Payer: MEDICARE

## 2021-04-12 ENCOUNTER — Ambulatory Visit: Admit: 2021-04-12 | Discharge: 2021-04-12 | Payer: MEDICARE

## 2021-04-12 DIAGNOSIS — R42 Dizziness and giddiness: Secondary | ICD-10-CM

## 2021-04-12 DIAGNOSIS — Z780 Asymptomatic menopausal state: Secondary | ICD-10-CM

## 2021-04-12 DIAGNOSIS — G43909 Migraine, unspecified, not intractable, without status migrainosus: Secondary | ICD-10-CM

## 2021-04-12 DIAGNOSIS — M1990 Unspecified osteoarthritis, unspecified site: Secondary | ICD-10-CM

## 2021-04-12 DIAGNOSIS — M8580 Other specified disorders of bone density and structure, unspecified site: Secondary | ICD-10-CM

## 2021-04-12 DIAGNOSIS — E039 Hypothyroidism, unspecified: Secondary | ICD-10-CM

## 2021-04-12 DIAGNOSIS — E785 Hyperlipidemia, unspecified: Secondary | ICD-10-CM

## 2021-04-12 DIAGNOSIS — J302 Other seasonal allergic rhinitis: Secondary | ICD-10-CM

## 2021-04-12 DIAGNOSIS — K219 Gastro-esophageal reflux disease without esophagitis: Secondary | ICD-10-CM

## 2021-04-12 DIAGNOSIS — N390 Urinary tract infection, site not specified: Secondary | ICD-10-CM

## 2021-04-12 DIAGNOSIS — K289 Gastrojejunal ulcer, unspecified as acute or chronic, without hemorrhage or perforation: Secondary | ICD-10-CM

## 2021-04-12 DIAGNOSIS — F32A Depression: Secondary | ICD-10-CM

## 2021-04-13 ENCOUNTER — Encounter: Admit: 2021-04-13 | Discharge: 2021-04-13 | Payer: MEDICARE

## 2021-04-13 MED ORDER — AMOXICILLIN-POT CLAVULANATE 875-125 MG PO TAB
1 | ORAL_TABLET | Freq: Two times a day (BID) | ORAL | 0 refills | 7.00000 days | Status: AC
Start: 2021-04-13 — End: ?

## 2021-04-13 NOTE — Telephone Encounter
augmentin sent to [facility]

## 2021-04-13 NOTE — Telephone Encounter
Pt LVM - having sinus problem causing sore throat at night, blowing nose - green drainage, asking for medication to help

## 2021-04-13 NOTE — Telephone Encounter
Patient with ongoing sinus problems since January 2023. States sinus pain/congestion started with trip to [place] and has been dealing with it on and off since January. Endorses moderate to severe sinus pain, varies, also reports green nasal drainage, nasal congestion, cough, and sore throat which she believes is irritation from drainage. Denies fever, difficulty breathing, earache.     Disposition of see in office today or tomorrow advised. Patient reports that she lives 90 minutes away and requests this RN reach out to PCP to see if medication can be called in. Preferred pharmacy is [facility] in [place] on [address]. Patient understands to go to Urgent care near her home if PCP not able to order medication or if she does not hear back from office team.         Reason for Disposition  ? Sinus congestion (pressure, fullness) present > 10 days    Answer Assessment - Initial Assessment Questions  1. LOCATION: Where does it hurt?       Sinus pain, on forehead, behind eyes and below eyes on cheeks  2. ONSET: When did the sinus pain start?  (e.g., hours, days)        Congestion has been ongoing since January, started with trip to [place]  3. SEVERITY: How bad is the pain?   (Scale 1-10; mild, moderate or severe)    - MILD (1-3): doesn't interfere with normal activities     - MODERATE (4-7): interferes with normal activities (e.g., work or school) or awakens from sleep    - SEVERE (8-10): excruciating pain and patient unable to do any normal activities         Patient describes as moderate to severe pain, varies  4. RECURRENT SYMPTOM: Have you ever had sinus problems before? If Yes, ask: When was the last time? and What happened that time?       Last had sinus issues in November 2022  5. NASAL CONGESTION: Is the nose blocked? If Yes, ask: Can you open it or must you breathe through your mouth?      Waking up with dry mouth because of blocked nose   6. NASAL DISCHARGE: Do you have discharge from your nose? If so ask, What color?      Yellow/green discharge, coughing up drainage  7. FEVER: Do you have a fever? If Yes, ask: What is it, how was it measured, and when did it start?       Denies  8. OTHER SYMPTOMS: Do you have any other symptoms? (e.g., sore throat, cough, earache, difficulty breathing)      Cough and sore throat believes from sinus drainage.  9. PREGNANCY: Is there any chance you are pregnant? When was your last menstrual period?      NA    Protocols used: SINUS PAIN OR CONGESTION-A-OH

## 2021-04-18 ENCOUNTER — Encounter: Admit: 2021-04-18 | Discharge: 2021-04-18 | Payer: MEDICARE

## 2021-04-18 DIAGNOSIS — E785 Hyperlipidemia, unspecified: Secondary | ICD-10-CM

## 2021-04-18 DIAGNOSIS — F32A Depression: Secondary | ICD-10-CM

## 2021-04-18 DIAGNOSIS — M1990 Unspecified osteoarthritis, unspecified site: Secondary | ICD-10-CM

## 2021-04-18 DIAGNOSIS — R42 Dizziness and giddiness: Secondary | ICD-10-CM

## 2021-04-18 DIAGNOSIS — K219 Gastro-esophageal reflux disease without esophagitis: Secondary | ICD-10-CM

## 2021-04-18 DIAGNOSIS — K289 Gastrojejunal ulcer, unspecified as acute or chronic, without hemorrhage or perforation: Secondary | ICD-10-CM

## 2021-04-18 DIAGNOSIS — E039 Hypothyroidism, unspecified: Secondary | ICD-10-CM

## 2021-04-18 DIAGNOSIS — G43909 Migraine, unspecified, not intractable, without status migrainosus: Secondary | ICD-10-CM

## 2021-04-18 DIAGNOSIS — N390 Urinary tract infection, site not specified: Secondary | ICD-10-CM

## 2021-04-18 DIAGNOSIS — J302 Other seasonal allergic rhinitis: Secondary | ICD-10-CM

## 2021-04-27 ENCOUNTER — Encounter: Admit: 2021-04-27 | Discharge: 2021-04-27 | Payer: MEDICARE

## 2021-04-27 ENCOUNTER — Ambulatory Visit: Admit: 2021-04-27 | Discharge: 2021-04-27 | Payer: MEDICARE

## 2021-04-27 DIAGNOSIS — R42 Dizziness and giddiness: Secondary | ICD-10-CM

## 2021-04-27 DIAGNOSIS — K289 Gastrojejunal ulcer, unspecified as acute or chronic, without hemorrhage or perforation: Secondary | ICD-10-CM

## 2021-04-27 DIAGNOSIS — G43909 Migraine, unspecified, not intractable, without status migrainosus: Secondary | ICD-10-CM

## 2021-04-27 DIAGNOSIS — M1990 Unspecified osteoarthritis, unspecified site: Secondary | ICD-10-CM

## 2021-04-27 DIAGNOSIS — F32A Depression: Secondary | ICD-10-CM

## 2021-04-27 DIAGNOSIS — K219 Gastro-esophageal reflux disease without esophagitis: Secondary | ICD-10-CM

## 2021-04-27 DIAGNOSIS — E039 Hypothyroidism, unspecified: Secondary | ICD-10-CM

## 2021-04-27 DIAGNOSIS — N390 Urinary tract infection, site not specified: Secondary | ICD-10-CM

## 2021-04-27 DIAGNOSIS — J302 Other seasonal allergic rhinitis: Secondary | ICD-10-CM

## 2021-04-27 DIAGNOSIS — E785 Hyperlipidemia, unspecified: Secondary | ICD-10-CM

## 2021-04-27 MED ORDER — PROPOFOL INJ 10 MG/ML IV VIAL
INTRAVENOUS | 0 refills | Status: DC
Start: 2021-04-27 — End: 2021-04-27

## 2021-04-27 MED ORDER — PROPOFOL 10 MG/ML IV EMUL 50 ML (INFUSION)(AM)(OR)
INTRAVENOUS | 0 refills | Status: DC
Start: 2021-04-27 — End: 2021-04-27

## 2021-04-28 ENCOUNTER — Encounter: Admit: 2021-04-28 | Discharge: 2021-04-28 | Payer: MEDICARE

## 2021-04-28 DIAGNOSIS — E039 Hypothyroidism, unspecified: Secondary | ICD-10-CM

## 2021-04-28 DIAGNOSIS — J302 Other seasonal allergic rhinitis: Secondary | ICD-10-CM

## 2021-04-28 DIAGNOSIS — E785 Hyperlipidemia, unspecified: Secondary | ICD-10-CM

## 2021-04-28 DIAGNOSIS — F32A Depression: Secondary | ICD-10-CM

## 2021-04-28 DIAGNOSIS — K219 Gastro-esophageal reflux disease without esophagitis: Secondary | ICD-10-CM

## 2021-04-28 DIAGNOSIS — N390 Urinary tract infection, site not specified: Secondary | ICD-10-CM

## 2021-04-28 DIAGNOSIS — R42 Dizziness and giddiness: Secondary | ICD-10-CM

## 2021-04-28 DIAGNOSIS — G43909 Migraine, unspecified, not intractable, without status migrainosus: Secondary | ICD-10-CM

## 2021-04-28 DIAGNOSIS — M1990 Unspecified osteoarthritis, unspecified site: Secondary | ICD-10-CM

## 2021-04-28 DIAGNOSIS — K289 Gastrojejunal ulcer, unspecified as acute or chronic, without hemorrhage or perforation: Secondary | ICD-10-CM

## 2021-05-17 ENCOUNTER — Encounter: Admit: 2021-05-17 | Discharge: 2021-05-17 | Payer: MEDICARE

## 2021-05-17 MED ORDER — FLUTICASONE PROPIONATE 50 MCG/ACTUATION NA SPSN
NASAL | 3 refills | 60.00000 days | Status: AC
Start: 2021-05-17 — End: ?

## 2021-05-24 ENCOUNTER — Encounter: Admit: 2021-05-24 | Discharge: 2021-05-24 | Payer: MEDICARE

## 2021-05-24 ENCOUNTER — Ambulatory Visit: Admit: 2021-05-24 | Discharge: 2021-05-24 | Payer: MEDICARE

## 2021-05-24 DIAGNOSIS — K289 Gastrojejunal ulcer, unspecified as acute or chronic, without hemorrhage or perforation: Secondary | ICD-10-CM

## 2021-05-24 DIAGNOSIS — E785 Hyperlipidemia, unspecified: Secondary | ICD-10-CM

## 2021-05-24 DIAGNOSIS — I10 Essential (primary) hypertension: Secondary | ICD-10-CM

## 2021-05-24 DIAGNOSIS — G43909 Migraine, unspecified, not intractable, without status migrainosus: Secondary | ICD-10-CM

## 2021-05-24 DIAGNOSIS — E039 Hypothyroidism, unspecified: Secondary | ICD-10-CM

## 2021-05-24 DIAGNOSIS — R931 Abnormal findings on diagnostic imaging of heart and coronary circulation: Secondary | ICD-10-CM

## 2021-05-24 DIAGNOSIS — F32A Depression: Secondary | ICD-10-CM

## 2021-05-24 DIAGNOSIS — N390 Urinary tract infection, site not specified: Secondary | ICD-10-CM

## 2021-05-24 DIAGNOSIS — J302 Other seasonal allergic rhinitis: Secondary | ICD-10-CM

## 2021-05-24 DIAGNOSIS — J069 Acute upper respiratory infection, unspecified: Secondary | ICD-10-CM

## 2021-05-24 DIAGNOSIS — K219 Gastro-esophageal reflux disease without esophagitis: Secondary | ICD-10-CM

## 2021-05-24 DIAGNOSIS — R42 Dizziness and giddiness: Secondary | ICD-10-CM

## 2021-05-24 DIAGNOSIS — M1990 Unspecified osteoarthritis, unspecified site: Secondary | ICD-10-CM

## 2021-05-24 DIAGNOSIS — Z0000 Encounter for general adult medical examination without abnormal findings: Secondary | ICD-10-CM

## 2021-05-24 LAB — CBC
HEMATOCRIT: 38 % (ref 36–45)
HEMOGLOBIN: 12 g/dL (ref 12.0–15.0)
MCH: 31 pg (ref 26–34)
MCHC: 33 g/dL (ref 32.0–36.0)
MCV: 95 FL (ref 80–100)
MPV: 7.5 FL (ref 7–11)
PLATELET COUNT: 287 K/UL (ref 150–400)
RDW: 14 % (ref 11–15)
WBC COUNT: 6.6 K/UL (ref 4.5–11.0)

## 2021-05-24 LAB — TSH WITH FREE T4 REFLEX: TSH: 1.1 uU/mL (ref 0.35–5.00)

## 2021-05-24 LAB — COMPREHENSIVE METABOLIC PANEL
ALT: 22 U/L (ref 7–56)
ANION GAP: 9 (ref 3–12)
CO2: 29 MMOL/L (ref 21–30)
EGFR: >60 mL/min (ref >60)
POTASSIUM: 4.1 MMOL/L (ref <100)
SODIUM: 140 MMOL/L (ref >40)

## 2021-05-24 LAB — LIPID PROFILE
CHOLESTEROL: 115 mg/dL (ref <200)
TRIGLYCERIDES: 179 mg/dL — ABNORMAL HIGH (ref <150)

## 2021-05-24 MED ORDER — DOXYCYCLINE HYCLATE 100 MG PO TAB
100 mg | ORAL_TABLET | Freq: Two times a day (BID) | ORAL | 0 refills | 8.00000 days | Status: AC
Start: 2021-05-24 — End: ?

## 2021-05-24 MED ORDER — ATORVASTATIN 80 MG PO TAB
ORAL_TABLET | 3 refills | Status: AC
Start: 2021-05-24 — End: ?

## 2021-05-24 MED ORDER — CLOBETASOL 0.05 % TP CREA
Freq: Two times a day (BID) | TOPICAL | 3 refills | Status: AC
Start: 2021-05-24 — End: ?

## 2021-05-24 NOTE — Patient Instructions
Health Maintenance   Topic Date Due    COVID-19 VACCINE (5 - Booster for Moderna series) 05/25/2022 (Originally 07/20/2020)    BREAST CANCER SCREENING  04/04/2022    PHYSICAL (COMPREHENSIVE) EXAM  05/25/2022    MEDICARE ANNUAL WELLNESS VISIT  05/25/2022    DTAP/TDAP VACCINES (3 - Td or Tdap) 02/12/2025    COLORECTAL CANCER SCREENING  04/27/2028    OSTEOPOROSIS SCREENING/MONITORING  Completed    SHINGLES RECOMBINANT VACCINE  Completed    PNEUMOCOCCAL VACCINE 65+ YRS  Completed    HEPATITIS C SCREENING  Completed    INFLUENZA VACCINE  Completed    ADVANCED CARE PLANNING DISCUSSION AND DOCUMENTATION  Completed

## 2021-05-24 NOTE — Progress Notes
Date of Service: 05/24/2021    Tonya Williamson is a 76 y.o. female. DOB: 1946-01-23   MRN#: [id number]    Subjective:       76 yo female here for an annual wellness visit   Hx of htn- hyperlipidemia-GERD-infrequent stress incontinence- hypothyroidism  Reviewed hx in epic briefly  Reviewed HRA  Health Risk Assessment Questionnaire  Current Care  List of Providers you have seen in the last two years: Dr Reita Cliche  Are you receiving home health?: No  During the past 4 weeks, how would you rate your health in general?: Very Good    Outside Care  Since your last PCP visit, have you received care outside of [facility]?: No    Physical Activity  Do you exercise or are you physically active?: Yes  How many days a week do you usually exercise or are physically active?: 3  On days when you exercised or were physically active, how many minutes was the activity?: 0  During the past four weeks, what was the hardest physical activity you could do for at least two minutes?: Moderate    Diet  In the past month, were you worried whether your food would run out before you or your family had money to buy more?: No  In the past 7 days, how many times did you eat fast food or junk food or pizza?: 1  In the past 7 days, how many servings of fruits or vegetables did you eat each day?: (!) 2-3  In the past 7 days, how many sodas and sugar sweetened drinks (regular, not diet) did you drink each day?: 1    Smoke/Tobacco Use  Are you currently a smoker?: No    Alcohol Use  Do you drink alcohol?: No    Depression Screen  Little interest or pleasure in doing things: Not at All  Feeling down, depressed or hopeless: Not at All    Pain  How would you rate your pain today?: Very mild pain    Ambulation  Do you use any assistive devices for ambulation?: No    Fall Risk  Does it take you longer than 30 seconds to get up and out of a chair?: No  Have you fallen in the past year?: No  Fall History (last 21mo): No Falls    Motor Vehicle Safety  Do you fasten your seat belt when you are in the car?: Yes    Sun Exposure  Do you protect yourself from the sun? For example, wear sunscreen when outside.: Yes    Hearing Loss  Do you have trouble hearing the television or radio when others do not?: No  Do you have to strain or struggle to hear/understand conversation?: No  Do you use hearing aids?: No    Urinary Incontinence  Have you had urine leakage in the past 6 months?: No  Does your urine leakage negatively impact your daily activities or sleep?: (not recorded)    Cognitive Impairment  During the past 12 months, have you experienced confusion or memory loss that is happening more often or is getting worse?: No    Functional Screen  Do you live alone?: No  Do you live at: Home  Can you drive your own car or travel alone by bus or taxi?: Yes  Can you shop for groceries or clothes without help?: Yes  Can you prepare your own meals?: Yes  Can you do your own housework without help?:  Yes  Can you handle your own money without help?: Yes  Do you need help eating, bathing, dressing, or getting around your home?: No  Do you feel safe?: Yes  Does anyone at home hurt you, hit you, or threaten you?: No  Have you ever been the victim of abuse?: No    Home Safety  Does your home have grab bars in the bathroom?: (!) No  Does your home have hand rails on stairs and steps?: No stairs at home  Does your home have functioning smoke alarms?: Yes    Advance Directive  Do you have a living will or Advance Directive?: (!) No  Are you interested in discussing the importance of a living will or Advance Directive?: No    Dental Screen  Have you had an exam by your dentist in the last year?: Yes    Vision Screen  Do you have diabetes?: No           Review of Systems   Constitutional: Negative for chills, diaphoresis and fever.        Wt stable    HENT: Positive for sore throat. Negative for hearing loss.         Has a PND- is light green   Eyes: Has glasses and contacts-  Goes to the eye doc yrly   Cardiovascular: Negative for chest pain, leg swelling and palpitations.   Respiratory: Positive for cough. Negative for shortness of breath and wheezing.         Her cough is occ productive- started 2 days ago  Took some tessalon perles last pm   Hematologic/Lymphatic: Does not bruise/bleed easily.   Skin:        Has a little eczema around her finger nails   Musculoskeletal:        Having some left knee pain- seeing Dr.TJ Allayne Gitelman next month   Gastrointestinal: Negative for abdominal pain, constipation, diarrhea, heartburn, hematochezia, melena, nausea and vomiting.   Genitourinary: Negative for dysuria and hematuria.   Neurological: Positive for headaches.   Psychiatric/Behavioral:        Her stress level is ok   Allergic/Immunologic:        May have some allergy sxs         Objective:     ? acetaminophen SR (TYLENOL) 650 mg tablet Take two tablets by mouth twice daily.   ? amitriptyline (ELAVIL) 25 mg tablet TAKE 1 TABLET BY MOUTH DAILY at bedtime   ? amLODIPine (NORVASC) 10 mg tablet TAKE 1 TABLET BY MOUTH DAILY   ? atorvastatin (LIPITOR) 80 mg tablet take one-half tablet BY MOUTH EVERY EVENING   ? benzonatate (TESSALON PERLES) 100 mg capsule Take two capsules by mouth every 8 hours as needed for Cough.   ? CALCIUM CARBONATE/VITAMIN D3 (CALCIUM + D PO) Take 1 tablet by mouth daily.   ? carvediloL (COREG) 3.125 mg tablet TAKE 1 TABLET BY MOUTH TWICE DAILY with meals. Take with food.   ? celecoxib (CELEBREX) 200 mg capsule TAKE 1 CAPSULE DAILY   ? dicyclomine (BENTYL) 10 mg capsule take one capsule by mouth twice daily   ? duloxetine DR (CYMBALTA) 60 mg capsule TAKE 1 CAPSULE BY MOUTH EVERY DAY   ? esomeprazole DR (NEXIUM) 40 mg capsule TAKE 1 CAPSULE TWICE DAILY ON AN EMPTY STOMACH AT     LEAST 1 HOUR BEFORE OR 2   HOURS AFTER FOOD   ? estradioL (ESTRACE) 0.5 mg tablet take 1 & 1/2 tablets  BY MOUTH DAILY   ? ezetimibe (ZETIA) 10 mg tablet take one tablet by mouth once daily   ? famotidine (PEPCID) 40 mg tablet TAKE ONE TABLET BY MOUTH DAILY   ? fluconazole (DIFLUCAN) 150 mg tablet Take 1 at the onset of a yeast infection- repeat in 48 hrs   ? fluticasone propionate (FLONASE) 50 mcg/actuation nasal spray, suspension USE 2 SPRAYS in each nostril AS DIRECTED DAILY. shake BEFORE USE   ? icosapent ethyL (VASCEPA) 1 gram capsule Take two capsules by mouth twice daily with meals.   ? levothyroxine (SYNTHROID) 150 mcg tablet TAKE ONE TABLET BY MOUTH DAILY 30 MINUTES BEFORE BREAKFAST.   ? lisinopriL (ZESTRIL) 40 mg tablet TAKE 1 TABLET BY MOUTH DAILY   ? methocarbamoL (ROBAXIN) 500 mg tablet TAKE ONE TABLET TO TWO TABLETS BY MOUTH FOUR TIMES DAILY AS NEEDED FOR SPASMS.   ? MULTIVITAMIN PO Take 1 tablet by mouth daily.   ? MYRBETRIQ 25 mg tablet TAKE 1 TABLET DAILY   ? peg-electrolyte solution (NULYTELY LEMON-LIME) 420 gram oral solution Mix as directed on package. Refrigerate once mixed. Do not mix greater than 24 hours prior to procedure. Drink 3/4 of bottle between 5pm and 7pm the night before procedure. Drink remaining 1/4 of bottle 5 hours prior to procedure.     Vitals:    05/24/21 1251   BP: 126/74   BP Source: Arm, Right Upper   Pulse: 71   Temp: 36.8 ?C (98.2 ?F)   SpO2: 96%   TempSrc: Temporal   PainSc: Two   Weight: 73 kg (161 lb)   Height: 155.6 cm (5' 1.25)     Body mass index is 30.17 kg/m?Marland Kitchen     Physical Exam  Vitals and nursing note reviewed.   Constitutional:       General: She is not in acute distress.     Appearance: Normal appearance. She is well-developed. She is not ill-appearing.   HENT:      Head: Normocephalic and atraumatic.      Right Ear: Tympanic membrane and ear canal normal.      Left Ear: Tympanic membrane and ear canal normal.      Mouth/Throat:      Mouth: Mucous membranes are moist.      Pharynx: Oropharynx is clear.   Eyes:      Conjunctiva/sclera: Conjunctivae normal.      Pupils: Pupils are equal, round, and reactive to light.   Neck: Thyroid: No thyromegaly.   Cardiovascular:      Rate and Rhythm: Normal rate and regular rhythm.      Heart sounds: No murmur heard.  Pulmonary:      Effort: Pulmonary effort is normal.      Breath sounds: Normal breath sounds.   Abdominal:      General: Bowel sounds are normal. There is no distension.      Palpations: Abdomen is soft. There is no mass.      Tenderness: There is no abdominal tenderness.   Genitourinary:     Comments: Breast exam- no dominant masses, dimpling, d/c, or adenopathy  Musculoskeletal:      Cervical back: Neck supple.      Right lower leg: No edema.      Left lower leg: No edema.   Lymphadenopathy:      Cervical: No cervical adenopathy.   Skin:     General: Skin is warm and dry.   Neurological:      General: No focal deficit  present.      Mental Status: She is alert and oriented to person, place, and time.   Psychiatric:         Mood and Affect: Mood normal.         Behavior: Behavior normal.         Thought Content: Thought content normal.         Judgment: Judgment normal.     a/p  Annual wellness visit- appears well- non smoker- done with paps- up to date on her mammogram- colonoscopy- dexa- immunizations - except for a covid booster which she declined- has had 4 shots  htn- good control  Hyperlipidemia- check lab  Hypothyroidism- check lab  URI- pt will check a covid test at home- ordered doxycycline empirically  Elevated CAC- seeing cardiology again this summer  rtc in 1 yr and prn

## 2021-06-10 ENCOUNTER — Encounter: Admit: 2021-06-10 | Discharge: 2021-06-10 | Payer: MEDICARE

## 2021-06-10 DIAGNOSIS — R197 Diarrhea, unspecified: Secondary | ICD-10-CM

## 2021-06-10 NOTE — Telephone Encounter
Placed the GI referral- please give her a scheduling number so she can call and try to schedule an appt  I also ordered stool testing as having more diarrhea the past 2 weeks to make sure does not have an infection in her stool- the samples needs to be really loose to watery

## 2021-06-10 NOTE — Telephone Encounter
Patient requesting Referral to GI doctor. Dr. Burnis Medin did her 04/27/21 colonoscopy and she liked her.     Has had 3 episodes of loose stools with severe low abdominal cramping in the last 2 weeks. Only has stomach pain during these episodes. pain will get so bad I have passed out in the past. Has dealt with these episodes x years. Has tried to stop eating/drinking dairy, but does admit to eating vanilla ice cream some in the past two weeks. Patient on Dicyclomine BID and Nexium BID.  Patient wants to see GI since the episodes have became more frequent in last two weeks. I just had my AWV with Dr. Reita Cliche 05/24/21.     Mother has hx of pancreatitis.     Patient denies any stomach pain while on phone with this RN. only when I have the loose stool episodes. Routing to provider.     Reason for Disposition  ? Abdominal pain is a chronic symptom (recurrent or ongoing AND lasting > 4 weeks)    Additional Information  ? Negative: MODERATE pain (e.g., interferes with normal activities that comes and goes (cramps) lasts > 24 hours  (Exception: Pain with Vomiting or Diarrhea - see that Protocol.)     Patient is not having any abdominal pain now. Only with the intermittent loose stools episodes is when she has abdominal pain.    Answer Assessment - Initial Assessment Questions  1. LOCATION: Where does it hurt?       Low abdomen, bilateral  2. RADIATION: Does the pain shoot anywhere else? (e.g., chest, back)      No.   3. ONSET: When did the pain begin? (e.g., minutes, hours or days ago)       Off and on x years, but has had 3 episodes in two weeks. Pain is so bad it has made patient pass out in the past. get so hot and nauseous  4. SUDDEN: Gradual or sudden onset?      Sudden.  5. PATTERN Does the pain come and go, or is it constant?     - If constant: Is it getting better, staying the same, or worsening?       (Note: Constant means the pain never goes away completely; most serious pain is constant and it progresses)      - If intermittent: How long does it last? Do you have pain now?      (Note: Intermittent means the pain goes away completely between bouts)      Just when needing to have bowel movement. episodes can last 30 minutes. Stools are loose.   6. SEVERITY: How bad is the pain?  (e.g., Scale 1-10; mild, moderate, or severe)    - MILD (1-3): doesn't interfere with normal activities, abdomen soft and not tender to touch     - MODERATE (4-7): interferes with normal activities or awakens from sleep, abdomen tender to touch     - SEVERE (8-10): excruciating pain, doubled over, unable to do any normal activities       Severe  7. RECURRENT SYMPTOM: Have you ever had this type of stomach pain before? If Yes, ask: When was the last time? and What happened that time?       Recurrent  8. CAUSE: What do you think is causing the stomach pain?      'thought I was lactose intolerant, but have been trying not to use eat dairy. I have eaten some vanilla ice cream in the last  two weeks, so that may be it.   9. RELIEVING/AGGRAVATING FACTORS: What makes it better or worse? (e.g., movement, antacids, bowel movement)      Nexium BID. Dicyclomine BID.   10. OTHER SYMPTOMS: Do you have any other symptoms? (e.g., back pain, diarrhea, fever, urination pain, vomiting)        Nausea. Has passed out in the past with these painful loose stool episodes.   11. PREGNANCY: Is there any chance you are pregnant? When was your last menstrual period?        NA.    Protocols used: ABDOMINAL PAIN - FEMALE-A-OH

## 2021-06-10 NOTE — Telephone Encounter
-----   Message from Basilio Cairo, RN sent at 06/10/2021 12:42 PM CDT -----  Regarding: FW: Diaherria with Severe Cramping in the mornings.  Contact: [phone]    ----- Message -----  From: Harriet Pho  Sent: 06/10/2021  12:26 PM CDT  To: [facility] Nurse  Subject: Diaherria with Severe Cramping in the mornin#    I have been problems with  Severe cramping and diaherria. So severe that I almost pass out.  I do take dicyclomine in the am and pm.  I did see  specialist many years go but I am not sure of the name,  I think i need to see a doctor for this problem. The resent colon test didn't reveal any problems.  I know I am having a problem with milk products and the medicine I am taking does not help that problem.  Thank you for you advice and suggestion on a doctor to see.    Tonya Williamson  DOB [birth year]

## 2021-06-10 NOTE — Telephone Encounter
Attempted to call pt. LM on cell- signed auth on file with Dr. Knox Saliva advice as noted below. Provided number for GI [phone] to schedule.  Provided call back number for any questions or may send a MyChart message.  Basilio Cairo, RN

## 2021-07-14 ENCOUNTER — Encounter: Admit: 2021-07-14 | Discharge: 2021-07-14 | Payer: MEDICARE

## 2021-08-01 ENCOUNTER — Encounter: Admit: 2021-08-01 | Discharge: 2021-08-01 | Payer: MEDICARE

## 2021-08-01 MED ORDER — DULOXETINE 60 MG PO CPDR
ORAL_CAPSULE | ORAL | 3 refills | 60.00000 days | Status: AC
Start: 2021-08-01 — End: ?

## 2021-08-04 ENCOUNTER — Encounter: Admit: 2021-08-04 | Discharge: 2021-08-04 | Payer: MEDICARE

## 2021-08-04 ENCOUNTER — Ambulatory Visit: Admit: 2021-08-04 | Discharge: 2021-08-04 | Payer: MEDICARE

## 2021-08-04 DIAGNOSIS — M1990 Unspecified osteoarthritis, unspecified site: Secondary | ICD-10-CM

## 2021-08-04 DIAGNOSIS — K219 Gastro-esophageal reflux disease without esophagitis: Secondary | ICD-10-CM

## 2021-08-04 DIAGNOSIS — F32A Depression: Secondary | ICD-10-CM

## 2021-08-04 DIAGNOSIS — G43909 Migraine, unspecified, not intractable, without status migrainosus: Secondary | ICD-10-CM

## 2021-08-04 DIAGNOSIS — E039 Hypothyroidism, unspecified: Secondary | ICD-10-CM

## 2021-08-04 DIAGNOSIS — J302 Other seasonal allergic rhinitis: Secondary | ICD-10-CM

## 2021-08-04 DIAGNOSIS — E785 Hyperlipidemia, unspecified: Secondary | ICD-10-CM

## 2021-08-04 DIAGNOSIS — N390 Urinary tract infection, site not specified: Secondary | ICD-10-CM

## 2021-08-04 DIAGNOSIS — Z01818 Encounter for other preprocedural examination: Secondary | ICD-10-CM

## 2021-08-04 DIAGNOSIS — R42 Dizziness and giddiness: Secondary | ICD-10-CM

## 2021-08-04 DIAGNOSIS — K289 Gastrojejunal ulcer, unspecified as acute or chronic, without hemorrhage or perforation: Secondary | ICD-10-CM

## 2021-08-04 DIAGNOSIS — M25562 Pain in left knee: Secondary | ICD-10-CM

## 2021-08-04 MED ORDER — CELECOXIB 200 MG PO CAP
200 mg | ORAL_CAPSULE | Freq: Every day | ORAL | 3 refills | Status: AC
Start: 2021-08-04 — End: ?

## 2021-09-06 ENCOUNTER — Encounter: Admit: 2021-09-06 | Discharge: 2021-09-06 | Payer: MEDICARE

## 2021-09-06 ENCOUNTER — Ambulatory Visit: Admit: 2021-09-06 | Discharge: 2021-09-07 | Payer: MEDICARE

## 2021-09-06 DIAGNOSIS — R931 Abnormal findings on diagnostic imaging of heart and coronary circulation: Secondary | ICD-10-CM

## 2021-09-06 DIAGNOSIS — N390 Urinary tract infection, site not specified: Secondary | ICD-10-CM

## 2021-09-06 DIAGNOSIS — R42 Dizziness and giddiness: Secondary | ICD-10-CM

## 2021-09-06 DIAGNOSIS — E039 Hypothyroidism, unspecified: Secondary | ICD-10-CM

## 2021-09-06 DIAGNOSIS — F32A Depression: Secondary | ICD-10-CM

## 2021-09-06 DIAGNOSIS — G43909 Migraine, unspecified, not intractable, without status migrainosus: Secondary | ICD-10-CM

## 2021-09-06 DIAGNOSIS — K219 Gastro-esophageal reflux disease without esophagitis: Secondary | ICD-10-CM

## 2021-09-06 DIAGNOSIS — M1990 Unspecified osteoarthritis, unspecified site: Secondary | ICD-10-CM

## 2021-09-06 DIAGNOSIS — K289 Gastrojejunal ulcer, unspecified as acute or chronic, without hemorrhage or perforation: Secondary | ICD-10-CM

## 2021-09-06 DIAGNOSIS — E785 Hyperlipidemia, unspecified: Secondary | ICD-10-CM

## 2021-09-06 DIAGNOSIS — J302 Other seasonal allergic rhinitis: Secondary | ICD-10-CM

## 2021-09-06 DIAGNOSIS — Z01810 Encounter for preprocedural cardiovascular examination: Secondary | ICD-10-CM

## 2021-09-06 DIAGNOSIS — E782 Mixed hyperlipidemia: Secondary | ICD-10-CM

## 2021-09-06 DIAGNOSIS — I10 Essential (primary) hypertension: Secondary | ICD-10-CM

## 2021-09-06 NOTE — Progress Notes
Date of Service: 09/06/2021    Tonya Williamson is a 76 y.o. female.       HPI     76 year old female with a history of hypertension hyperlipidemia hypothyroidism and coronary artery disease by elevated calcium score of 217?in 2019?returns for follow-up visit?via telehealth.    She was last seen by me in September 2022.    She is due to have knee replacement of her left knee on August 22.    She denies any chest pain or shortness of breath no PND orthopnea or swelling of ankles.  Her mobility is limited by her knee pain.    She had a normal nuclear stress test in September 2022.    She previously had underwent shoulder surgery which was successful.  ?  An echocardiogram?also at that time?revealed EF of 60 to 65% with mild tricuspid regurgitation. ?Estimated peak systolic PA pressure was 28 mmHg  ?  Her last lipid profile from April 2023 showed an LDL of 49.  Triglycerides were elevated at 179.Marland Kitchen ?She is on a?atorvastatin?Zetia and Vascepa.  ?           Vitals:    09/06/21 0910   BP: 134/80  Comment: on June 22 nd  2023  Dr. Reita Cliche   BP Source: Arm, Left Upper   PainSc: Zero   Weight: 70.8 kg (156 lb)   Height: 166.4 cm (5' 5.5)     Body mass index is 25.56 kg/m?Marland Kitchen     Past Medical History  Patient Active Problem List    Diagnosis Date Noted   ? Serrated polyp of colon 04/28/2021     Next screening colonoscopy in 7 years, 2030.     ? Preop cardiovascular exam 04/19/2020   ? Pre-diabetes 08/06/2019     A1C 6.3 in 2018     ? Personal history of malignant neoplasm of skin 11/19/2018   ? Unspecified fall, initial encounter 11/19/2018   ? Elevated coronary artery calcium score 01/02/2018     Total calcium 217.5  Lt Main 17.3, LAD 108.7, LCX 6.7, RCA 84.7,      ? 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 Osteopenia 12/02/2013   ? 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 Vertigo 03/14/2011   ? 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 + leak and poor tone, no prolapse on exam 02/2011.   UDS (03/2011) demonstrating large volume incontinence from DO and LPP, emptied completely. Hx of supracervical hysterectomy  Did not like drying effects of toviaz; continues on estrace.  Very pleased with improvements in SUI and UUI on PFRT and wishes to hold mirabegron       ? Rosacea 04/03/2006   ? Hypothyroid    ? Migraine      better     ? Essential hypertension    ? GERD (gastroesophageal reflux disease)    ? Hyperlipidemia    ? Depression          Review of Systems   Constitutional: Negative.   HENT: Negative.    Eyes: Negative.    Cardiovascular: Negative.    Respiratory: Negative.    Endocrine: Negative.    Hematologic/Lymphatic: Negative.    Skin: Negative.    Musculoskeletal: Negative.    Gastrointestinal: Negative.    Genitourinary: Negative.    Neurological: Negative.    Psychiatric/Behavioral: Negative.  Allergic/Immunologic: Negative.    All other systems reviewed and are negative.      Physical Exam  Limited by telehealth  Comfortable at rest    Cardiovascular Studies  As above.    Cardiovascular Health Factors  Vitals BP Readings from Last 3 Encounters:   09/06/21 134/80   08/04/21 134/80   05/24/21 126/74     Wt Readings from Last 3 Encounters:   09/06/21 70.8 kg (156 lb)   08/04/21 72.8 kg (160 lb 8 oz)   05/24/21 73 kg (161 lb)     BMI Readings from Last 3 Encounters:   09/06/21 25.56 kg/m?   08/04/21 30.08 kg/m?   05/24/21 30.17 kg/m?      Smoking Social History     Tobacco Use   Smoking Status Never   Smokeless Tobacco Never   Vaping Use   ? Vaping Use: Never used      Lipid Profile Cholesterol   Date Value Ref Range Status   05/24/2021 115 <200 MG/DL Final     HDL   Date Value Ref Range Status   05/24/2021 48 >40 MG/DL Final     LDL   Date Value Ref Range Status   05/24/2021 49 <100 mg/dL Final     Triglycerides   Date Value Ref Range Status   05/24/2021 179 (H) <150 MG/DL Final      Blood Sugar Hemoglobin A1C   Date Value Ref Range Status   10/31/2019 5.8 4.0 - 6.0 % Final     Comment:     The ADA recommends that most patients with type 1 and type 2 diabetes maintain   an A1c level <7%.       Glucose   Date Value Ref Range Status   05/24/2021 78 70 - 100 MG/DL Final   82/95/6213 87 70 - 100 MG/DL Final   08/65/7846 87 70 - 100 MG/DL Final   96/29/5284 93 70 - 100 MG/DL Final   13/24/4010 90 70 - 110 MG/DL Final   27/25/3664 77 70 - 110 MG/DL Final     Glucose Fasting   Date Value Ref Range Status   10/31/2019 97 70 - 100 MG/DL Final   40/34/7425 956 70 - 100 MG/DL Final          Problems Addressed Today  Encounter Diagnoses   Name Primary?   ? Preop cardiovascular exam Yes   ? Essential hypertension    ? Mixed hyperlipidemia    ? Elevated coronary artery calcium score        Assessment and Plan     ?  Tonya Williamson?has a history of hypertension hyperlipidemia and elevated calcium score/coronary artery disease. ?  She is due to have left knee surgery in August and is here for preoperative assessment.    Her last ECG I have on file is from March 2022 which showed sinus rhythm rate 61 bpm.    Her last echocardiogram in 2019 showed preserved ejection fraction.    Most recently in September 2022 she underwent a nuclear stress test which was nonischemic.    Her last lipid profile shows her triglycerides are elevated at 179 but the LDL is controlled at 49.  ?  She will continue following a heart healthy diet.    Given that she is asymptomatic from a cardiac standpoint and her stress test was recent, we do not need to arrange for any further noninvasive testing prior to her surgery.    In reviewing  her data and current presentation it would be my impression that she would be a low risk candidate for cardiovascular complications from intermediate risk noncardiac surgery.  ?  She should continue with a heart healthy diet and regular exercise.??  ?  I appreciate the opportunity of seeing?Tonya.?Williamson?via telehealth today.??She will return for follow-up with me in 6-9 months. ?      ?         Current Medications (including today's revisions)  ? acetaminophen SR (TYLENOL) 650 mg tablet Take two tablets by mouth twice daily.   ? amitriptyline (ELAVIL) 25 mg tablet TAKE 1 TABLET BY MOUTH DAILY at bedtime   ? amLODIPine (NORVASC) 10 mg tablet TAKE 1 TABLET BY MOUTH DAILY   ? atorvastatin (LIPITOR) 80 mg tablet take one-half tablet BY MOUTH EVERY EVENING   ? CALCIUM CARBONATE/VITAMIN D3 (CALCIUM + D PO) Take 1 tablet by mouth daily.   ? carvediloL (COREG) 3.125 mg tablet TAKE 1 TABLET BY MOUTH TWICE DAILY with meals. Take with food.   ? celecoxib (CELEBREX) 200 mg capsule Take one capsule by mouth daily.   ? clobetasoL (TEMOVATE) 0.05 % topical cream Apply  topically to affected area twice daily.   ? dicyclomine (BENTYL) 10 mg capsule take one capsule by mouth twice daily   ? duloxetine DR (CYMBALTA) 60 mg capsule TAKE 1 CAPSULE BY MOUTH DAILY   ? esomeprazole DR (NEXIUM) 40 mg capsule TAKE 1 CAPSULE TWICE DAILY ON AN EMPTY STOMACH AT     LEAST 1 HOUR BEFORE OR 2   HOURS AFTER FOOD   ? estradioL (ESTRACE) 0.5 mg tablet take 1 & 1/2 tablets BY MOUTH DAILY   ? ezetimibe (ZETIA) 10 mg tablet take one tablet by mouth once daily   ? famotidine (PEPCID) 40 mg tablet TAKE ONE TABLET BY MOUTH DAILY   ? fluticasone propionate (FLONASE) 50 mcg/actuation nasal spray, suspension USE 2 SPRAYS in each nostril AS DIRECTED DAILY. shake BEFORE USE   ? icosapent ethyL (VASCEPA) 1 gram capsule Take two capsules by mouth twice daily with meals.   ? levothyroxine (SYNTHROID) 150 mcg tablet TAKE ONE TABLET BY MOUTH DAILY 30 MINUTES BEFORE BREAKFAST.   ? lisinopriL (ZESTRIL) 40 mg tablet TAKE 1 TABLET BY MOUTH DAILY   ? methocarbamoL (ROBAXIN) 500 mg tablet TAKE ONE TABLET TO TWO TABLETS BY MOUTH FOUR TIMES DAILY AS NEEDED FOR SPASMS.   ? MULTIVITAMIN PO Take 1 tablet by mouth daily.   ? MYRBETRIQ 25 mg tablet TAKE 1 TABLET DAILY           Telehealth 25 to 30 minutes

## 2021-09-15 ENCOUNTER — Ambulatory Visit: Admit: 2021-09-15 | Discharge: 2021-09-15 | Payer: MEDICARE

## 2021-09-15 ENCOUNTER — Encounter: Admit: 2021-09-15 | Discharge: 2021-09-15 | Payer: MEDICARE

## 2021-09-15 DIAGNOSIS — Z01818 Encounter for other preprocedural examination: Secondary | ICD-10-CM

## 2021-09-15 LAB — CBC AND DIFF
ABSOLUTE BASO COUNT: 0 K/UL (ref 0–0.20)
ABSOLUTE EOS COUNT: 0.3 K/UL (ref 0–0.45)
ABSOLUTE LYMPH COUNT: 1.8 K/UL (ref 1.0–4.8)
ABSOLUTE MONO COUNT: 0.5 K/UL (ref 0–0.80)
ABSOLUTE NEUTROPHIL: 2.4 K/UL (ref 1.8–7.0)
BASOPHILS %: 1 % (ref 0–2)
EOSINOPHILS %: 7 % — ABNORMAL HIGH (ref 0–5)
LYMPHOCYTES %: 35 % (ref >60)
MCH: 32 pg (ref 26–34)
MCHC: 33 g/dL (ref 32.0–36.0)
MONOCYTES %: 10 % (ref 4–12)
MPV: 7.6 FL (ref 7–11)
NEUTROPHILS %: 47 % (ref 41–77)
PLATELET COUNT: 337 K/UL (ref 150–400)
RBC COUNT: 4.1 M/UL (ref 4.0–5.0)
RDW: 13 % (ref 11–15)
WBC COUNT: 5.2 K/UL (ref 4.5–11.0)

## 2021-09-15 LAB — SED RATE: ESR: 11 mm/h (ref 0–30)

## 2021-09-15 LAB — URINALYSIS, MICROSCOPIC

## 2021-09-15 LAB — PROTIME INR (PT)
INR: 1 % (ref 0.8–1.2)
PROTIME: 11 s (ref 9.5–14.2)

## 2021-09-15 LAB — URINALYSIS DIPSTICK
LEUKOCYTES: NEGATIVE
NITRITE: NEGATIVE
URINE ASCORBIC ACID, UA: NEGATIVE
URINE BILE: NEGATIVE
URINE BLOOD: NEGATIVE
URINE SPEC GRAVITY: 1 (ref 1.005–1.030)

## 2021-09-15 LAB — COMPREHENSIVE METABOLIC PANEL
GLUCOSE,PANEL: 92 mg/dL (ref 70–100)
POTASSIUM: 4.1 MMOL/L (ref 3.5–5.1)
SODIUM: 140 MMOL/L (ref 137–147)

## 2021-09-15 LAB — PTT (APTT): PTT: 29 s (ref 24.0–36.5)

## 2021-09-16 ENCOUNTER — Encounter: Admit: 2021-09-16 | Discharge: 2021-09-16 | Payer: MEDICARE

## 2021-09-16 NOTE — Telephone Encounter
Prior auth. Form from [facility] for Esomeprazole 40mg . Filled out and reviewed by Dr. , stated it was all good to be faxed back.    Ph. [phone]  Fax [phone]

## 2021-09-17 ENCOUNTER — Encounter: Admit: 2021-09-17 | Discharge: 2021-09-17 | Payer: MEDICARE

## 2021-09-18 NOTE — Telephone Encounter
Her labs looked fine from 09-15-21- will have the labs- cxr- ekg and last office note faxed to Dr. Margo Aye Rasmussen's office along with the clearance letter

## 2021-10-23 ENCOUNTER — Encounter: Admit: 2021-10-23 | Discharge: 2021-10-23 | Payer: MEDICARE

## 2021-10-23 MED ORDER — DICYCLOMINE 10 MG PO CAP
10 mg | ORAL_CAPSULE | Freq: Two times a day (BID) | ORAL | 3 refills | Status: AC
Start: 2021-10-23 — End: ?

## 2021-10-23 MED ORDER — ICOSAPENT ETHYL 1 GRAM PO CAP
2 g | ORAL_CAPSULE | Freq: Two times a day (BID) | ORAL | 3 refills
Start: 2021-10-23 — End: ?

## 2021-10-24 ENCOUNTER — Encounter: Admit: 2021-10-24 | Discharge: 2021-10-24 | Payer: MEDICARE

## 2021-11-07 ENCOUNTER — Encounter: Admit: 2021-11-07 | Discharge: 2021-11-07 | Payer: MEDICARE

## 2021-11-07 ENCOUNTER — Ambulatory Visit: Admit: 2021-11-07 | Discharge: 2021-11-08 | Payer: MEDICARE

## 2021-11-07 ENCOUNTER — Ambulatory Visit: Admit: 2021-11-07 | Discharge: 2021-11-07 | Payer: MEDICARE

## 2021-11-07 DIAGNOSIS — R42 Dizziness and giddiness: Secondary | ICD-10-CM

## 2021-11-07 DIAGNOSIS — K219 Gastro-esophageal reflux disease without esophagitis: Secondary | ICD-10-CM

## 2021-11-07 DIAGNOSIS — F32A Depression: Secondary | ICD-10-CM

## 2021-11-07 DIAGNOSIS — K9041 Non-celiac gluten sensitivity: Secondary | ICD-10-CM

## 2021-11-07 DIAGNOSIS — G43909 Migraine, unspecified, not intractable, without status migrainosus: Secondary | ICD-10-CM

## 2021-11-07 DIAGNOSIS — E039 Hypothyroidism, unspecified: Secondary | ICD-10-CM

## 2021-11-07 DIAGNOSIS — E785 Hyperlipidemia, unspecified: Secondary | ICD-10-CM

## 2021-11-07 DIAGNOSIS — K289 Gastrojejunal ulcer, unspecified as acute or chronic, without hemorrhage or perforation: Secondary | ICD-10-CM

## 2021-11-07 DIAGNOSIS — R197 Diarrhea, unspecified: Secondary | ICD-10-CM

## 2021-11-07 DIAGNOSIS — M1990 Unspecified osteoarthritis, unspecified site: Secondary | ICD-10-CM

## 2021-11-07 DIAGNOSIS — J302 Other seasonal allergic rhinitis: Secondary | ICD-10-CM

## 2021-11-07 DIAGNOSIS — N390 Urinary tract infection, site not specified: Secondary | ICD-10-CM

## 2021-11-08 DIAGNOSIS — K9041 Non-celiac gluten sensitivity: Secondary | ICD-10-CM

## 2021-11-22 ENCOUNTER — Encounter: Admit: 2021-11-22 | Discharge: 2021-11-22 | Payer: MEDICARE

## 2021-12-15 ENCOUNTER — Encounter: Admit: 2021-12-15 | Discharge: 2021-12-15 | Payer: MEDICARE

## 2021-12-15 DIAGNOSIS — N3941 Urge incontinence: Secondary | ICD-10-CM

## 2021-12-15 MED ORDER — MYRBETRIQ 25 MG PO TB24
ORAL_TABLET | 3 refills | Status: AC
Start: 2021-12-15 — End: ?

## 2021-12-15 MED ORDER — ESOMEPRAZOLE MAGNESIUM 40 MG PO CPDR
ORAL_CAPSULE | 3 refills | 30.00000 days | Status: AC
Start: 2021-12-15 — End: ?

## 2022-01-23 ENCOUNTER — Encounter: Admit: 2022-01-23 | Discharge: 2022-01-23 | Payer: MEDICARE

## 2022-01-23 MED ORDER — FAMOTIDINE 40 MG PO TAB
40 mg | ORAL_TABLET | Freq: Every day | ORAL | 1 refills | 90.00000 days | Status: AC
Start: 2022-01-23 — End: ?

## 2022-01-23 MED ORDER — AMLODIPINE 10 MG PO TAB
ORAL_TABLET | 1 refills | Status: AC
Start: 2022-01-23 — End: ?

## 2022-01-25 ENCOUNTER — Encounter: Admit: 2022-01-25 | Discharge: 2022-01-25 | Payer: MEDICARE

## 2022-01-25 ENCOUNTER — Ambulatory Visit: Admit: 2022-01-25 | Discharge: 2022-01-26 | Payer: MEDICARE

## 2022-01-25 DIAGNOSIS — K9041 Non-celiac gluten sensitivity: Secondary | ICD-10-CM

## 2022-01-25 DIAGNOSIS — K289 Gastrojejunal ulcer, unspecified as acute or chronic, without hemorrhage or perforation: Secondary | ICD-10-CM

## 2022-01-25 DIAGNOSIS — G43909 Migraine, unspecified, not intractable, without status migrainosus: Secondary | ICD-10-CM

## 2022-01-25 DIAGNOSIS — E039 Hypothyroidism, unspecified: Secondary | ICD-10-CM

## 2022-01-25 DIAGNOSIS — E785 Hyperlipidemia, unspecified: Secondary | ICD-10-CM

## 2022-01-25 DIAGNOSIS — K219 Gastro-esophageal reflux disease without esophagitis: Secondary | ICD-10-CM

## 2022-01-25 DIAGNOSIS — F32A Depression: Secondary | ICD-10-CM

## 2022-01-25 DIAGNOSIS — N390 Urinary tract infection, site not specified: Secondary | ICD-10-CM

## 2022-01-25 DIAGNOSIS — R197 Diarrhea, unspecified: Secondary | ICD-10-CM

## 2022-01-25 DIAGNOSIS — M1990 Unspecified osteoarthritis, unspecified site: Secondary | ICD-10-CM

## 2022-01-25 DIAGNOSIS — J302 Other seasonal allergic rhinitis: Secondary | ICD-10-CM

## 2022-01-25 DIAGNOSIS — R42 Dizziness and giddiness: Secondary | ICD-10-CM

## 2022-01-25 NOTE — Progress Notes
Telehealth Visit Note    Date of Service: 01/25/2022    Subjective:       Tonya Williamson is a 76 y.o. female.    History of Present Illness    Tonya Williamson is a 76 y.o. pleasant female patient of Dr. Wynonia Lawman presenting today for follow up. Past medical and surgical history listed below. Patient saw Dr. Wynonia Lawman for chronic diarrhea. She was advised to try a gluten free/lactose free diet and was given dicyclomine for abdominal pain and cramping. Patient states symptoms have improved greatly. She has not had an episode of diarrhea in 2-3 weeks. She has changed diet. She adds toast to her daily breakfast regimen instead of just coffee. She has also added gluten free products and seen a difference. She states symptoms are worse in times of anxiety and stress as well. She is still taking dicyclomine as needed, but has not had any episodes of abdominal pain or cramping recently. Denies nausea or vomiting. She has Imodium to use with any flare ups as well. She states she is now having a bowel movement every other day. It is soft and formed. Denies melena, steatorrhea, or hematochezia. No longer having borborygmus. She takes Nexium 40 mg BID for acid reflux and states this helps. Denies any breakthrough acid reflux, dysphagia, pain with swallowing, heartburn, choking sensation, epigastric pain, or regurgitation. Denies any unintentional weight loss. Appetite is good. Colonoscopy on 04/2021 showed one sessile serrated adenoma that was removed. No other GI symptoms and overall has seen big improvements. Patient tearful at appointment today due to some added stress in her life. I advised her to follow up with PCP regarding anxiety symptoms.       Diagnosis Date    Arthritis      Depression      Dizziness      GERD (gastroesophageal reflux disease)      HTN      Hyperlipidemia      Hypothyroid      Migraine      Seasonal allergic reaction      Ulcer of the stomach and intestine      Urinary tract infection Surgical History:   Procedure Laterality Date    HX CHOLECYSTECTOMY   2002    SINUS SURGERY   2003    HX KNEE ARTHROSCOPY   2007     rt- scope    CERVICAL SPINE SURGERY   04/15/2013    ENDOSCOPY   03/30/2015     gastric polyps    HYDROGEN BREATH TEST N/A 05/21/2015     Performed by Virgina Organ, MD at [facility] ENDO    CARDIOVASCULAR SCAN   12/14/2017     scored a 217.5- mostly in her LAD and RCA    UPPER GASTROINTESTINAL ENDOSCOPY   12/06/2018     small hiatal hernia    ESOPHAGOGASTRODUODENOSCOPY WITH SPECIMEN COLLECTION BY BRUSHING/ WASHING N/A 12/06/2018     Performed by Everardo All, MD at [facility] OR    CARDIOVASCULAR STRESS TEST   11/03/2020     neg- EF 75%    MAMMO HISTORICAL REPORT   04/04/2021     neg    [facility] MED HISTORICAL REPORT   04/12/2021     osteopenia    COLONOSCOPY REPORT   04/27/2021     hemorrhoids- polyp- 7 yr plan    COLONOSCOPY DIAGNOSTIC WITH SPECIMEN COLLECTION BY BRUSHING/ WASHING - FLEXIBLE N/A 04/27/2021     Performed by Jolee Ewing,  MD at [id number] OR    HX CESAREAN SECTION        HX JOINT REPLACEMENT Bilateral       both shoulders, and right knee    HX KNEE SURGERY   05-07-12- 08-21-14     rt- TKR- redo 08-21-14- Dr. Domenic Moras    HX TAH AND BSO         endometriosis    HX TONSILLECTOMY        HX TUBAL LIGATION        SHOULDER SURGERY Left        ssessment and Plan:  76 years old Caucasian female with history of diarrhea/loose bowel movement for several years, previously seen by Collins Scotland, ARNP ([id number]), reconsulted for further evaluation of chronic diarrhea.  Long discussion with patient about differential diagnosis most likely IBS, functional diarrhea, diarrhea associated with lactose intolerance and less likely nonsevere gluten sensitivity.  Also discussed about benign nature of her symptoms and functional symptomatology.     We discussed in detail about lactose-free milk and avoid lactose-containing product.  We also discussed about a trial of gluten-free diet for 6/8 weeks since it is a contributing factor in many IBS and functional diarrhea.     Also discussed to continue dicyclomine as previously prescribed given symptomatic improvement of cramps and diarrhea.  Patient stated no need for new prescription since she has enough supply.     Also discussed to reevaluate in GI/telehealth clinic in 3-4 months and if not adequate response to current management, will consider flexible sigmoidoscopy and biopsy for microscopic colitis.    Dicyclomine?-working   Flex sig ?  Bx for microscopitc colitis?  Lactose/gluten free diet?       Review of Systems   Constitutional: Negative.    HENT: Negative.     Eyes: Negative.    Respiratory: Negative.     Cardiovascular: Negative.    Gastrointestinal: Negative.    Endocrine: Negative.    Genitourinary: Negative.    Musculoskeletal: Negative.    Skin: Negative.    Allergic/Immunologic: Negative.    Neurological: Negative.    Hematological: Negative.    Psychiatric/Behavioral: Negative.     All other systems reviewed and are negative.    .  Objective:          acetaminophen SR (TYLENOL) 650 mg tablet Take two tablets by mouth twice daily.    amitriptyline (ELAVIL) 25 mg tablet TAKE 1 TABLET BY MOUTH DAILY at bedtime    amLODIPine (NORVASC) 10 mg tablet TAKE 1 TABLET BY MOUTH DAILY    atorvastatin (LIPITOR) 80 mg tablet take one-half tablet BY MOUTH EVERY EVENING    CALCIUM CARBONATE/VITAMIN D3 (CALCIUM + D PO) Take 1 tablet by mouth daily.    carvediloL (COREG) 3.125 mg tablet TAKE 1 TABLET BY MOUTH TWICE DAILY with meals. Take with food.    celecoxib (CELEBREX) 200 mg capsule Take one capsule by mouth daily.    clobetasoL (TEMOVATE) 0.05 % topical cream Apply  topically to affected area twice daily.    dicyclomine (BENTYL) 10 mg capsule take one capsule by mouth twice daily    duloxetine DR (CYMBALTA) 60 mg capsule TAKE 1 CAPSULE BY MOUTH DAILY    esomeprazole DR (NEXIUM) 40 mg capsule TAKE 1 CAPSULE TWICE DAILY ON AN EMPTY STOMACH AT     LEAST 1 HOUR BEFORE OR 2   HOURS AFTER FOOD    estradioL (ESTRACE) 0.5 mg tablet take 1 & 1/2 tablets BY  MOUTH DAILY    ezetimibe (ZETIA) 10 mg tablet take one tablet by mouth once daily    famotidine (PEPCID) 40 mg tablet TAKE ONE TABLET BY MOUTH DAILY    fluticasone propionate (FLONASE) 50 mcg/actuation nasal spray, suspension USE 2 SPRAYS in each nostril AS DIRECTED DAILY. shake BEFORE USE    icosapent ethyL (VASCEPA) 1 gram capsule take 2 capsules BY MOUTH TWICE DAILY WITH FOOD    levothyroxine (SYNTHROID) 150 mcg tablet TAKE ONE TABLET BY MOUTH DAILY 30 MINUTES BEFORE BREAKFAST.    lisinopriL (ZESTRIL) 40 mg tablet TAKE 1 TABLET BY MOUTH DAILY    methocarbamoL (ROBAXIN) 500 mg tablet TAKE ONE TABLET TO TWO TABLETS BY MOUTH FOUR TIMES DAILY AS NEEDED FOR SPASMS.    MULTIVITAMIN PO Take 1 tablet by mouth daily.    MYRBETRIQ 25 mg ER tablet TAKE 1 TABLET DAILY          Telehealth Patient Reported Vitals       Row Name 01/25/22 4540                Weight: 70.3 kg (155 lb)  per pt        Height: 165.1 cm (5' 5)        Pain Score: Zero                      Telehealth Body Mass Index: 25.79 at 01/25/2022  9:24 AM    Physical Exam  Constitutional:       General: She is awake.      Appearance: Normal appearance.      Comments: Unable to complete physical exam due to telehealth   Neurological:      Mental Status: She is alert.   Psychiatric:         Attention and Perception: Attention normal.         Behavior: Behavior is cooperative.          Assessment and Plan:  1. Diarrhea, unspecified type  -Improved  -Use Imodium as needed  -Continue dicyclomine as needed for any flare ups  -Patient will MyChart [place] in a few weeks to let us know how she is doing and if things are continuing to improve  -If no improvement we will schedule flexible sigmoidoscopy with biopsies for microscopic colitis       2. Gluten-sensitive enteropathy  -Continue working on gluten free diet and lactose free diet              RTC: 6 months with me via telehealth, sooner if things worsen          Total Time Today was 30 minutes in the following activities: Preparing to see the patient, Obtaining and/or reviewing separately obtained history, Performing a medically appropriate examination and/or evaluation, Counseling and educating the patient/family/caregiver, Ordering medications, tests, or procedures, Referring and communication with other health care professionals (when not separately reported), and Documenting clinical information in the electronic or other health record         30 minutes spent on this patient's encounter with counseling and coordination of care taking >50% of the visit.

## 2022-01-31 ENCOUNTER — Encounter: Admit: 2022-01-31 | Discharge: 2022-01-31 | Payer: MEDICARE

## 2022-01-31 MED ORDER — AMOXICILLIN-POT CLAVULANATE 875-125 MG PO TAB
1 | ORAL_TABLET | Freq: Two times a day (BID) | ORAL | 0 refills | 7.00000 days | Status: AC
Start: 2022-01-31 — End: ?

## 2022-01-31 NOTE — Telephone Encounter
Sent augmentin to [employer]

## 2022-01-31 NOTE — Telephone Encounter
Emmalou called in today with complaints of severe sinus congestion, cough and runny nose. No fever or SOB noted. She states that she is prone to sinus infections around this time of the year. She is using flonase, but is not taking any other medication at this time. I recommended the UC, but she states that her PCP 'usually calls in an antibiotic.' Will send to PCP for further guidance.     Reason for Disposition   SEVERE sinus pain    Answer Assessment - Initial Assessment Questions  1. LOCATION: "Where does it hurt?"       Head congestion  2. ONSET: "When did the sinus pain start?"  (e.g., hours, days)       Week and a half   3. SEVERITY: "How bad is the pain?"   (Scale 1-10; mild, moderate or severe)    - MILD (1-3): doesn't interfere with normal activities     - MODERATE (4-7): interferes with normal activities (e.g., work or school) or awakens from sleep    - SEVERE (8-10): excruciating pain and patient unable to do any normal activities         Moderate   4. RECURRENT SYMPTOM: "Have you ever had sinus problems before?" If Yes, ask: "When was the last time?" and "What happened that time?"       Yes every year   5. NASAL CONGESTION: "Is the nose blocked?" If Yes, ask: "Can you open it or must you breathe through your mouth?"      Flonase and can breath   6. NASAL DISCHARGE: "Do you have discharge from your nose?" If so ask, "What color?"      Yes light yellow   7. FEVER: "Do you have a fever?" If Yes, ask: "What is it, how was it measured, and when did it start?"       no  8. OTHER SYMPTOMS: "Do you have any other symptoms?" (e.g., sore throat, cough, earache, difficulty breathing)      Sore throat and cough   9. PREGNANCY: "Is there any chance you are pregnant?" "When was your last menstrual period?"      No    Protocols used: Sinus Pain or Congestion-A-[place]

## 2022-01-31 NOTE — Telephone Encounter
Spoke with pt about prescription sent in. Verbalized appreciation.     Gregary Signs, RN

## 2022-01-31 NOTE — Telephone Encounter
Patient asking if PCP can call in some "sinus medicine", reports headache/sinus pain and productive cough, green mucus.

## 2022-02-09 ENCOUNTER — Encounter: Admit: 2022-02-09 | Discharge: 2022-02-09 | Payer: MEDICARE

## 2022-02-20 ENCOUNTER — Encounter: Admit: 2022-02-20 | Discharge: 2022-02-20 | Payer: MEDICARE

## 2022-02-20 ENCOUNTER — Ambulatory Visit: Admit: 2022-02-20 | Discharge: 2022-02-20 | Payer: MEDICARE

## 2022-02-20 DIAGNOSIS — F32A Depression: Secondary | ICD-10-CM

## 2022-02-20 DIAGNOSIS — E785 Hyperlipidemia, unspecified: Secondary | ICD-10-CM

## 2022-02-20 DIAGNOSIS — J302 Other seasonal allergic rhinitis: Secondary | ICD-10-CM

## 2022-02-20 DIAGNOSIS — J0191 Acute recurrent sinusitis, unspecified: Secondary | ICD-10-CM

## 2022-02-20 DIAGNOSIS — M1990 Unspecified osteoarthritis, unspecified site: Secondary | ICD-10-CM

## 2022-02-20 DIAGNOSIS — N390 Urinary tract infection, site not specified: Secondary | ICD-10-CM

## 2022-02-20 DIAGNOSIS — E039 Hypothyroidism, unspecified: Secondary | ICD-10-CM

## 2022-02-20 DIAGNOSIS — K219 Gastro-esophageal reflux disease without esophagitis: Secondary | ICD-10-CM

## 2022-02-20 DIAGNOSIS — R42 Dizziness and giddiness: Secondary | ICD-10-CM

## 2022-02-20 DIAGNOSIS — K289 Gastrojejunal ulcer, unspecified as acute or chronic, without hemorrhage or perforation: Secondary | ICD-10-CM

## 2022-02-20 DIAGNOSIS — G43909 Migraine, unspecified, not intractable, without status migrainosus: Secondary | ICD-10-CM

## 2022-02-20 MED ORDER — FLUCONAZOLE 150 MG PO TAB
ORAL_TABLET | ORAL | 0 refills | 3.00000 days | Status: AC
Start: 2022-02-20 — End: ?

## 2022-02-20 MED ORDER — DOXYCYCLINE HYCLATE 100 MG PO TAB
100 mg | ORAL_TABLET | Freq: Two times a day (BID) | ORAL | 0 refills | 8.00000 days | Status: AC
Start: 2022-02-20 — End: ?

## 2022-02-20 MED ORDER — METHYLPREDNISOLONE 4 MG PO DSPK
ORAL_TABLET | 0 refills | Status: AC
Start: 2022-02-20 — End: ?

## 2022-02-20 NOTE — Progress Notes
Date of Service: 02/20/2022    Tonya Williamson is a 77 y.o. female. DOB: [birth year]   MRN#: [id number]    Subjective:       History of Present Illness         ROS      Objective:      acetaminophen SR (TYLENOL) 650 mg tablet Take two tablets by mouth twice daily.    amitriptyline (ELAVIL) 25 mg tablet TAKE 1 TABLET BY MOUTH DAILY at bedtime    amLODIPine (NORVASC) 10 mg tablet TAKE 1 TABLET BY MOUTH DAILY    atorvastatin (LIPITOR) 80 mg tablet take one-half tablet BY MOUTH EVERY EVENING    CALCIUM CARBONATE/VITAMIN D3 (CALCIUM + D PO) Take 1 tablet by mouth daily.    carvediloL (COREG) 3.125 mg tablet TAKE 1 TABLET BY MOUTH TWICE DAILY with meals. Take with food.    celecoxib (CELEBREX) 200 mg capsule Take one capsule by mouth daily.    clobetasoL (TEMOVATE) 0.05 % topical cream Apply  topically to affected area twice daily.    dicyclomine (BENTYL) 10 mg capsule take one capsule by mouth twice daily    duloxetine DR (CYMBALTA) 60 mg capsule TAKE 1 CAPSULE BY MOUTH DAILY    esomeprazole DR (NEXIUM) 40 mg capsule TAKE 1 CAPSULE TWICE DAILY ON AN EMPTY STOMACH AT     LEAST 1 HOUR BEFORE OR 2   HOURS AFTER FOOD    estradioL (ESTRACE) 0.5 mg tablet take 1 & 1/2 tablets BY MOUTH DAILY    ezetimibe (ZETIA) 10 mg tablet take one tablet by mouth once daily    famotidine (PEPCID) 40 mg tablet TAKE ONE TABLET BY MOUTH DAILY    fluticasone propionate (FLONASE) 50 mcg/actuation nasal spray, suspension USE 2 SPRAYS in each nostril AS DIRECTED DAILY. shake BEFORE USE    icosapent ethyL (VASCEPA) 1 gram capsule take 2 capsules BY MOUTH TWICE DAILY WITH FOOD    levothyroxine (SYNTHROID) 150 mcg tablet TAKE ONE TABLET BY MOUTH DAILY 30 MINUTES BEFORE BREAKFAST.    lisinopriL (ZESTRIL) 40 mg tablet TAKE 1 TABLET BY MOUTH DAILY    methocarbamoL (ROBAXIN) 500 mg tablet TAKE ONE TABLET TO TWO TABLETS BY MOUTH FOUR TIMES DAILY AS NEEDED FOR SPASMS.    MULTIVITAMIN PO Take 1 tablet by mouth daily.    MYRBETRIQ 25 mg ER tablet TAKE 1 TABLET DAILY     Vitals:    02/20/22 1545   BP: 114/66   BP Source: Arm, Left Upper   Pulse: 76   Temp: 36.4 ?C (97.6 ?F)   TempSrc: Temporal   PainSc: Ten   Weight: 75.1 kg (165 lb 9 oz)     Body mass index is 27.55 kg/m?Marland Kitchen     Physical Exam         Assessment and Plan:  Muriah J. Padgett was seen today for sinus pain.    Diagnoses and all orders for this visit:    Acute recurrent sinusitis, unspecified location  -     methylPREDNIsolone (MEDROL (PAK)) 4 mg tablet; Take medication as directed on package for 6 days. Take with food.  -     doxycycline hyclate (VIBRACIN) 100 mg tablet; Take one tablet by mouth twice daily.  -     fluconazole (DIFLUCAN) 150 mg tablet; Take 1 dose for yeast infection repeat in 2-3 days if needed

## 2022-03-29 ENCOUNTER — Encounter: Admit: 2022-03-29 | Discharge: 2022-03-29 | Payer: MEDICARE

## 2022-03-29 MED ORDER — DULOXETINE 60 MG PO CPDR
60 mg | ORAL_CAPSULE | Freq: Every day | ORAL | 3 refills | 60.00000 days | Status: AC
Start: 2022-03-29 — End: ?

## 2022-04-25 ENCOUNTER — Encounter: Admit: 2022-04-25 | Discharge: 2022-04-25 | Payer: MEDICARE

## 2022-04-25 NOTE — Progress Notes
PA approved from 02/13/2022-04/25/2023

## 2022-04-25 NOTE — Progress Notes
Prior Authorization for Vascepa submitted on CMM.     Key: [id number]

## 2022-05-04 ENCOUNTER — Encounter: Admit: 2022-05-04 | Discharge: 2022-05-04 | Payer: MEDICARE

## 2022-05-04 MED ORDER — CELECOXIB 200 MG PO CAP
200 mg | ORAL_CAPSULE | Freq: Every day | ORAL | 0 refills | Status: AC
Start: 2022-05-04 — End: ?

## 2022-05-04 MED ORDER — ESOMEPRAZOLE MAGNESIUM 40 MG PO CPDR
40 mg | ORAL_CAPSULE | Freq: Two times a day (BID) | ORAL | 0 refills | 30.00000 days | Status: AC
Start: 2022-05-04 — End: ?

## 2022-05-09 ENCOUNTER — Encounter: Admit: 2022-05-09 | Discharge: 2022-05-09 | Payer: MEDICARE

## 2022-05-09 MED ORDER — AMITRIPTYLINE 25 MG PO TAB
ORAL_TABLET | 5 refills | Status: AC
Start: 2022-05-09 — End: ?

## 2022-05-09 MED ORDER — METHOCARBAMOL 500 MG PO TAB
500-1000 mg | ORAL_TABLET | Freq: Four times a day (QID) | ORAL | 5 refills | Status: AC | PRN
Start: 2022-05-09 — End: ?

## 2022-05-09 NOTE — Telephone Encounter
Refill request for: robaxin and amitriptyline   Last office visit: 02/20/22  Next office visit: 08/02/22  Last refill: 03/19/21, 02/08/21    Routing to Dr. Jeronimo Greaves for approval/denial.

## 2022-05-10 ENCOUNTER — Encounter: Admit: 2022-05-10 | Discharge: 2022-05-10 | Payer: MEDICARE

## 2022-05-10 DIAGNOSIS — E039 Hypothyroidism, unspecified: Secondary | ICD-10-CM

## 2022-05-10 MED ORDER — ESTRADIOL 0.5 MG PO TAB
ORAL_TABLET | 1 refills | 30.00000 days | Status: AC
Start: 2022-05-10 — End: ?

## 2022-05-10 MED ORDER — LEVOTHYROXINE 150 MCG PO TAB
150 ug | ORAL_TABLET | Freq: Every day | ORAL | 1 refills | 30.00000 days | Status: AC
Start: 2022-05-10 — End: ?

## 2022-05-10 MED ORDER — EZETIMIBE 10 MG PO TAB
ORAL_TABLET | 1 refills | Status: AC
Start: 2022-05-10 — End: ?

## 2022-05-10 MED ORDER — LISINOPRIL 40 MG PO TAB
ORAL_TABLET | 1 refills | Status: AC
Start: 2022-05-10 — End: ?

## 2022-05-10 MED ORDER — CARVEDILOL 3.125 MG PO TAB
ORAL_TABLET | ORAL | 1 refills | 90.00000 days | Status: AC
Start: 2022-05-10 — End: ?

## 2022-05-10 NOTE — Telephone Encounter
Refill request for: carvedilol, levothyroxine, lisinopril, estradiol, zetia   Last office visit: 02/20/22  Next office visit: 08/02/22  Last refill: 02/08/21,     Routing to Dr. Jeronimo Greaves for approval/denial.

## 2022-05-11 ENCOUNTER — Encounter: Admit: 2022-05-11 | Discharge: 2022-05-11 | Payer: MEDICARE

## 2022-05-11 DIAGNOSIS — E039 Hypothyroidism, unspecified: Secondary | ICD-10-CM

## 2022-05-11 MED ORDER — LEVOTHYROXINE 150 MCG PO TAB
150 ug | ORAL_TABLET | Freq: Every day | ORAL | 1 refills
Start: 2022-05-11 — End: ?

## 2022-05-11 MED ORDER — EZETIMIBE 10 MG PO TAB
10 mg | ORAL_TABLET | Freq: Every day | ORAL | 1 refills
Start: 2022-05-11 — End: ?

## 2022-05-11 MED ORDER — ESTRADIOL 0.5 MG PO TAB
.75 mg | ORAL_TABLET | Freq: Every day | ORAL | 1 refills
Start: 2022-05-11 — End: ?

## 2022-05-11 MED ORDER — LISINOPRIL 40 MG PO TAB
40 mg | ORAL_TABLET | Freq: Every day | ORAL | 1 refills
Start: 2022-05-11 — End: ?

## 2022-05-11 MED ORDER — CARVEDILOL 3.125 MG PO TAB
3.125 mg | ORAL_TABLET | Freq: Two times a day (BID) | ORAL | 1 refills
Start: 2022-05-11 — End: ?

## 2022-05-12 ENCOUNTER — Encounter: Admit: 2022-05-12 | Discharge: 2022-05-12 | Payer: MEDICARE

## 2022-05-12 MED ORDER — DULOXETINE 60 MG PO CPDR
60 mg | ORAL_CAPSULE | Freq: Every day | ORAL | 3 refills | 60.00000 days | Status: AC
Start: 2022-05-12 — End: ?

## 2022-05-30 ENCOUNTER — Encounter: Admit: 2022-05-30 | Discharge: 2022-05-30 | Payer: MEDICARE

## 2022-05-30 MED ORDER — FLUTICASONE PROPIONATE 50 MCG/ACTUATION NA SPSN
NASAL | 3 refills | 60.00000 days | Status: AC
Start: 2022-05-30 — End: ?

## 2022-06-29 ENCOUNTER — Encounter: Admit: 2022-06-29 | Discharge: 2022-06-29 | Payer: MEDICARE

## 2022-06-29 ENCOUNTER — Ambulatory Visit: Admit: 2022-06-29 | Discharge: 2022-06-30 | Payer: MEDICARE

## 2022-06-29 DIAGNOSIS — G43909 Migraine, unspecified, not intractable, without status migrainosus: Secondary | ICD-10-CM

## 2022-06-29 DIAGNOSIS — M1990 Unspecified osteoarthritis, unspecified site: Secondary | ICD-10-CM

## 2022-06-29 DIAGNOSIS — E782 Mixed hyperlipidemia: Secondary | ICD-10-CM

## 2022-06-29 DIAGNOSIS — R42 Dizziness and giddiness: Secondary | ICD-10-CM

## 2022-06-29 DIAGNOSIS — K219 Gastro-esophageal reflux disease without esophagitis: Secondary | ICD-10-CM

## 2022-06-29 DIAGNOSIS — I495 Sick sinus syndrome: Secondary | ICD-10-CM

## 2022-06-29 DIAGNOSIS — K289 Gastrojejunal ulcer, unspecified as acute or chronic, without hemorrhage or perforation: Secondary | ICD-10-CM

## 2022-06-29 DIAGNOSIS — J302 Other seasonal allergic rhinitis: Secondary | ICD-10-CM

## 2022-06-29 DIAGNOSIS — E781 Pure hyperglyceridemia: Secondary | ICD-10-CM

## 2022-06-29 DIAGNOSIS — F32A Depression: Secondary | ICD-10-CM

## 2022-06-29 DIAGNOSIS — E785 Hyperlipidemia, unspecified: Secondary | ICD-10-CM

## 2022-06-29 DIAGNOSIS — I10 Essential (primary) hypertension: Secondary | ICD-10-CM

## 2022-06-29 DIAGNOSIS — R931 Abnormal findings on diagnostic imaging of heart and coronary circulation: Secondary | ICD-10-CM

## 2022-06-29 DIAGNOSIS — E039 Hypothyroidism, unspecified: Secondary | ICD-10-CM

## 2022-06-29 DIAGNOSIS — N390 Urinary tract infection, site not specified: Secondary | ICD-10-CM

## 2022-07-04 ENCOUNTER — Encounter: Admit: 2022-07-04 | Discharge: 2022-07-04 | Payer: MEDICARE

## 2022-07-04 NOTE — Progress Notes
Initiated a prior authorization in CoverMyMeds for the medication Esomeprazole 40mg  capsule

## 2022-07-14 ENCOUNTER — Encounter: Admit: 2022-07-14 | Discharge: 2022-07-14 | Payer: MEDICARE

## 2022-07-14 MED ORDER — ESOMEPRAZOLE MAGNESIUM 40 MG PO CPDR
40 mg | ORAL_CAPSULE | Freq: Two times a day (BID) | ORAL | 0 refills | 30.00000 days | Status: AC
Start: 2022-07-14 — End: ?

## 2022-07-14 NOTE — Telephone Encounter
Medication Request: Esomeprazole Mag. 40 MG DR Cap.   Last office visit: 02/20/2022  Next office visit: 08/02/2022  Last labs: 11/07/2021  Routing to Dr. Reita Cliche for approval / denial

## 2022-07-17 ENCOUNTER — Encounter: Admit: 2022-07-17 | Discharge: 2022-07-17 | Payer: MEDICARE

## 2022-07-17 MED ORDER — FAMOTIDINE 40 MG PO TAB
40 mg | ORAL_TABLET | Freq: Every day | ORAL | 0 refills | 90.00000 days | Status: AC
Start: 2022-07-17 — End: ?

## 2022-07-17 MED ORDER — AMLODIPINE 10 MG PO TAB
ORAL_TABLET | 0 refills | Status: AC
Start: 2022-07-17 — End: ?

## 2022-07-17 MED ORDER — ATORVASTATIN 80 MG PO TAB
ORAL_TABLET | 0 refills | Status: AC
Start: 2022-07-17 — End: ?

## 2022-07-17 NOTE — Telephone Encounter
Refill request for: atorvastatin, amlodipine, and famotidine  Last Office Visit: 02/20/22 OV  Next Office Visit: 08/02/22 AWV  Last Labs: 10/04/21    Routing to Dr. Reita Cliche for approval/denial.

## 2022-07-27 ENCOUNTER — Encounter: Admit: 2022-07-27 | Discharge: 2022-07-27 | Payer: MEDICARE

## 2022-07-27 ENCOUNTER — Ambulatory Visit: Admit: 2022-07-27 | Discharge: 2022-07-28 | Payer: MEDICARE

## 2022-07-27 DIAGNOSIS — J302 Other seasonal allergic rhinitis: Secondary | ICD-10-CM

## 2022-07-27 DIAGNOSIS — G43909 Migraine, unspecified, not intractable, without status migrainosus: Secondary | ICD-10-CM

## 2022-07-27 DIAGNOSIS — E039 Hypothyroidism, unspecified: Secondary | ICD-10-CM

## 2022-07-27 DIAGNOSIS — K289 Gastrojejunal ulcer, unspecified as acute or chronic, without hemorrhage or perforation: Secondary | ICD-10-CM

## 2022-07-27 DIAGNOSIS — R42 Dizziness and giddiness: Secondary | ICD-10-CM

## 2022-07-27 DIAGNOSIS — M1990 Unspecified osteoarthritis, unspecified site: Secondary | ICD-10-CM

## 2022-07-27 DIAGNOSIS — K219 Gastro-esophageal reflux disease without esophagitis: Secondary | ICD-10-CM

## 2022-07-27 DIAGNOSIS — E781 Pure hyperglyceridemia: Secondary | ICD-10-CM

## 2022-07-27 DIAGNOSIS — E785 Hyperlipidemia, unspecified: Secondary | ICD-10-CM

## 2022-07-27 DIAGNOSIS — F32A Depression: Secondary | ICD-10-CM

## 2022-07-27 DIAGNOSIS — N390 Urinary tract infection, site not specified: Secondary | ICD-10-CM

## 2022-07-27 DIAGNOSIS — I495 Sick sinus syndrome: Secondary | ICD-10-CM

## 2022-08-02 ENCOUNTER — Encounter: Admit: 2022-08-02 | Discharge: 2022-08-02 | Payer: MEDICARE

## 2022-08-02 ENCOUNTER — Ambulatory Visit: Admit: 2022-08-02 | Discharge: 2022-08-03 | Payer: MEDICARE

## 2022-08-02 DIAGNOSIS — I495 Sick sinus syndrome: Secondary | ICD-10-CM

## 2022-08-02 DIAGNOSIS — M1990 Unspecified osteoarthritis, unspecified site: Secondary | ICD-10-CM

## 2022-08-02 DIAGNOSIS — E785 Hyperlipidemia, unspecified: Secondary | ICD-10-CM

## 2022-08-02 DIAGNOSIS — J302 Other seasonal allergic rhinitis: Secondary | ICD-10-CM

## 2022-08-02 DIAGNOSIS — N390 Urinary tract infection, site not specified: Secondary | ICD-10-CM

## 2022-08-02 DIAGNOSIS — M8580 Other specified disorders of bone density and structure, unspecified site: Secondary | ICD-10-CM

## 2022-08-02 DIAGNOSIS — R42 Dizziness and giddiness: Secondary | ICD-10-CM

## 2022-08-02 DIAGNOSIS — K219 Gastro-esophageal reflux disease without esophagitis: Secondary | ICD-10-CM

## 2022-08-02 DIAGNOSIS — Z1231 Encounter for screening mammogram for malignant neoplasm of breast: Secondary | ICD-10-CM

## 2022-08-02 DIAGNOSIS — E039 Hypothyroidism, unspecified: Secondary | ICD-10-CM

## 2022-08-02 DIAGNOSIS — E781 Pure hyperglyceridemia: Secondary | ICD-10-CM

## 2022-08-02 DIAGNOSIS — K289 Gastrojejunal ulcer, unspecified as acute or chronic, without hemorrhage or perforation: Secondary | ICD-10-CM

## 2022-08-02 DIAGNOSIS — F32A Depression: Secondary | ICD-10-CM

## 2022-08-02 DIAGNOSIS — G43909 Migraine, unspecified, not intractable, without status migrainosus: Secondary | ICD-10-CM

## 2022-08-02 MED ORDER — CELECOXIB 200 MG PO CAP
200 mg | ORAL_CAPSULE | Freq: Every day | ORAL | 0 refills | Status: AC
Start: 2022-08-02 — End: ?

## 2022-08-02 MED ORDER — MIRABEGRON 25 MG PO TB24
25 mg | ORAL_TABLET | Freq: Every day | ORAL | 3 refills | Status: AC
Start: 2022-08-02 — End: ?

## 2022-08-02 NOTE — Progress Notes
Date of Service: 08/02/2022    Tonya Williamson is a 77 y.o. female. DOB: 10/25/1945   MRN#: [id number]    Subjective:       77 yo female here for an annual wellness visit   Reviewed hx in epic briefly  Hx of htn- hyperlipidemia-hypothyroidism - GERD  Feeling well overall  Swimming and walking for exercise   Reviewed HRA  Health Risk Assessment Questionnaire  Current Care  List of Providers you have seen in the last two years: None  Are you receiving home health?: No  During the past 4 weeks, how would you rate your health in general?: Very Good    Outside Care  Since your last PCP visit, have you received care outside of [facility]?: No    Physical Activity  Do you exercise or are you physically active?: Yes  How many days a week do you usually exercise or are physically active?: 4  On days when you exercised or were physically active, how many minutes was the activity?: 45  During the past four weeks, what was the hardest physical activity you could do for at least two minutes?: Moderate    Diet  In the past month, were you worried whether your food would run out before you or your family had money to buy more?: No  In the past 7 days, how many times did you eat fast food or junk food or pizza?: 1  In the past 7 days, how many servings of fruits or vegetables did you eat each day?: (!) 2-3  In the past 7 days, how many sodas and sugar sweetened drinks (regular, not diet) did you drink each day?: 0    Smoke/Tobacco Use  Are you currently a smoker?: No    Alcohol Use  Do you drink alcohol?: No    Depression Screen  Little interest or pleasure in doing things: Not at all  Feeling down, depressed or hopeless: Not at all    Pain  How would you rate your pain today?: No pain    Ambulation  Do you use any assistive devices for ambulation?: No    Fall Risk  Does it take you longer than 30 seconds to get up and out of a chair?: No  Have you fallen in the past year?: No    Motor Vehicle Safety  Do you fasten your seat belt when you are in the car?: Yes    Sun Exposure  Do you protect yourself from the sun? For example, wear sunscreen when outside.: Yes    Hearing Loss  Do you have trouble hearing the television or radio when others do not?: No  Do you have to strain or struggle to hear/understand conversation?: No  Do you use hearing aids?: (!) Yes    Urinary Incontinence  Have you had urine leakage in the past 6 months?: No  Does your urine leakage negatively impact your daily activities or sleep?: (not recorded)    Cognitive Impairment  During the past 12 months, have you experienced confusion or memory loss that is happening more often or is getting worse?: No    Functional Screen  Do you live alone?: No  Do you live at: Home  Can you drive your own car or travel alone by bus or taxi?: Yes  Can you shop for groceries or clothes without help?: Yes  Can you prepare your own meals?: Yes  Can you do your own housework  without help?: Yes  Can you handle your own money without help?: Yes  Do you need help eating, bathing, dressing, or getting around your home?: (!) Yes  Do you feel safe?: Yes  Does anyone at home hurt you, hit you, or threaten you?: No  Have you ever been the victim of abuse?: No    Home Safety  Does your home have grab bars in the bathroom?: (!) No  Does your home have hand rails on stairs and steps?: Yes  Does your home have functioning smoke alarms?: Yes    Advance Directive  Do you have a living will or Advance Directive?: (!) No  Are you interested in discussing the importance of a living will or Advance Directive?: No    Dental Screen  Have you had an exam by your dentist in the last year?: Yes    Vision Screen  Do you have diabetes?: No            Review of Systems   Constitutional: Negative for chills, diaphoresis and fever.        Wt stable   HENT:          Had her hearing tested last week  Needs hearing aides  Had a URI 3-4 weeks - sxs have resolved   Goes to the dentist every 6 months- no current dental issues she is aware of   Eyes:         Has glasses and contacts- goes to the eye doc yrly   Cardiovascular:  Negative for chest pain and palpitations.        Sees cardiology  Her left ankle can swell a bit- compression socks help- on norvasc   Respiratory:  Negative for cough and shortness of breath.    Hematologic/Lymphatic: Does not bruise/bleed easily.   Skin:         Sees derm yrly   Musculoskeletal: Negative.    Gastrointestinal:  Negative for abdominal pain, constipation, diarrhea, hematochezia, melena, nausea and vomiting.        GERD sxs are under control     Genitourinary:  Negative for dysuria and hematuria.   Neurological:         Has an occ headache   Psychiatric/Behavioral:          Can have allergy sxs  Stress level is ok       Objective:      acetaminophen SR (TYLENOL) 650 mg tablet Take two tablets by mouth twice daily.    amitriptyline (ELAVIL) 25 mg tablet TAKE 1 TABLET BY MOUTH DAILY at bedtime    amLODIPine (NORVASC) 10 mg tablet TAKE 1 TABLET BY MOUTH DAILY    atorvastatin (LIPITOR) 80 mg tablet take 1/2 tablet BY MOUTH EVERY EVENING    CALCIUM CARBONATE/VITAMIN D3 (CALCIUM + D PO) Take 1 tablet by mouth daily.    carvediloL (COREG) 3.125 mg tablet Take one tablet by mouth twice daily. TAKE WITH FOOD.    celecoxib (CELEBREX) 200 mg capsule Take one capsule by mouth daily.    clobetasoL (TEMOVATE) 0.05 % topical cream Apply  topically to affected area twice daily.    dicyclomine (BENTYL) 10 mg capsule take one capsule by mouth twice daily    doxycycline hyclate (VIBRACIN) 100 mg tablet Take one tablet by mouth twice daily.    duloxetine DR (CYMBALTA) 60 mg capsule TAKE 1 CAPSULE BY MOUTH DAILY    esomeprazole DR (NEXIUM) 40 mg capsule Take one capsule by mouth twice  daily. Take on an empty stomach at least 1 hour before or 2 hours after food.    estradioL (ESTRACE) 0.5 mg tablet Take 1.5 tablets by mouth daily.    ezetimibe (ZETIA) 10 mg tablet Take one tablet by mouth daily.    famotidine (PEPCID) 40 mg tablet TAKE 1 TABLET BY MOUTH DAILY    fluconazole (DIFLUCAN) 150 mg tablet Take 1 dose for yeast infection repeat in 2-3 days if needed    fluticasone propionate (FLONASE) 50 mcg/actuation nasal spray, suspension USE 2 SPRAYS in each nostril AS DIRECTED DAILY. shake BEFORE USE    icosapent ethyL (VASCEPA) 1 gram capsule take 2 capsules BY MOUTH TWICE DAILY WITH FOOD    levothyroxine (SYNTHROID) 150 mcg tablet Take one tablet by mouth daily 30 minutes before breakfast.    lisinopriL (ZESTRIL) 40 mg tablet Take one tablet by mouth daily.    methocarbamoL (ROBAXIN) 500 mg tablet TAKE ONE TABLET TO TWO TABLETS BY MOUTH FOUR TIMES DAILY AS NEEDED FOR SPASMS.    methylPREDNIsolone (MEDROL (PAK)) 4 mg tablet Take medication as directed on package for 6 days. Take with food.    MULTIVITAMIN PO Take 1 tablet by mouth daily.    MYRBETRIQ 25 mg ER tablet TAKE 1 TABLET DAILY     Vitals:    08/02/22 1301   BP: 138/62   BP Source: Arm, Left Upper   Pulse: 69   Temp: 36.6 ?C (97.9 ?F)   SpO2: 96%   TempSrc: Temporal   PainSc: Zero   Weight: 75.9 kg (167 lb 6.4 oz)   Height: 163 cm (5' 4.17)     Body mass index is 28.58 kg/m?Marland Kitchen     Physical Exam  Vitals and nursing note reviewed.   Constitutional:       General: She is not in acute distress.     Appearance: Normal appearance. She is well-developed. She is not ill-appearing.      Comments: Bp was 120-130/70 on recheck   HENT:      Head: Normocephalic and atraumatic.      Right Ear: Tympanic membrane and ear canal normal.      Left Ear: Tympanic membrane and ear canal normal.      Mouth/Throat:      Mouth: Mucous membranes are moist.      Pharynx: Oropharynx is clear.   Eyes:      Conjunctiva/sclera: Conjunctivae normal.      Pupils: Pupils are equal, round, and reactive to light.   Neck:      Thyroid: No thyromegaly.      Vascular: No carotid bruit.   Cardiovascular:      Rate and Rhythm: Normal rate and regular rhythm.      Heart sounds: No murmur heard.  Pulmonary:      Effort: Pulmonary effort is normal.      Breath sounds: Normal breath sounds.   Abdominal:      General: Bowel sounds are normal. There is no distension.      Palpations: Abdomen is soft. There is no mass.      Tenderness: There is no abdominal tenderness.   Genitourinary:     Comments: Breast exam- no dominant masses, dimpling, d/c, or adenopathy   Musculoskeletal:      Cervical back: Neck supple.      Right lower leg: No edema.      Left lower leg: No edema.   Lymphadenopathy:      Cervical: No cervical adenopathy.  Skin:     General: Skin is warm and dry.   Neurological:      General: No focal deficit present.      Mental Status: She is alert and oriented to person, place, and time.   Psychiatric:         Mood and Affect: Mood normal.         Behavior: Behavior normal.         Thought Content: Thought content normal.         Judgment: Judgment normal.     A/p  Annual wellness visit- appears well- non smoker-bmi over goal at over 28- done with paps- up to date on her colonoscopy and dexa- due for an RSV vaccine at her pharmacy  Urge incontinence- rfed her myrbetriq  Screening mammogram ordered  Osteopenia- check vit d level  Hyperlipidemia- check lab  Htn- bp was 120-130/70 on recheck   Hypothyroidism - check tsh  Rfed needed meds- rtc in 1 yr and prn

## 2022-08-02 NOTE — Patient Instructions
Health Maintenance   Topic Date Due    ADVANCE CARE PLANNING DISCUSSION AND DOCUMENTATION  05/25/2022    COVID-19 VACCINE (5 - 2023-24 season) 10/23/2022 (Originally 10/14/2021)    RSV VACCINE (60 YEARS AND OLDER) (1 - 1-dose 60+ series) 08/24/2023 (Originally 12/19/2006)    INFLUENZA VACCINE (Season Ended) 09/14/2022    MEDICARE ANNUAL WELLNESS VISIT  08/02/2023    DTAP/TDAP VACCINES (3 - Td or Tdap) 02/12/2025    COLORECTAL CANCER SCREENING  04/27/2028    OSTEOPOROSIS SCREENING/MONITORING  Completed    SHINGLES RECOMBINANT VACCINE  Completed    PNEUMOCOCCAL VACCINE 65+ YRS  Completed    HEPATITIS C SCREENING  Completed    HPV VACCINES  Aged Out    BREAST CANCER SCREENING  Discontinued    Radiology- [phone]

## 2022-08-03 DIAGNOSIS — I10 Essential (primary) hypertension: Secondary | ICD-10-CM

## 2022-08-03 DIAGNOSIS — N3941 Urge incontinence: Secondary | ICD-10-CM

## 2022-08-03 DIAGNOSIS — Z0000 Encounter for general adult medical examination without abnormal findings: Secondary | ICD-10-CM

## 2022-08-04 ENCOUNTER — Encounter: Admit: 2022-08-04 | Discharge: 2022-08-04 | Payer: MEDICARE

## 2022-08-04 MED ORDER — CELECOXIB 200 MG PO CAP
200 mg | ORAL_CAPSULE | Freq: Every day | ORAL | 3 refills | Status: AC
Start: 2022-08-04 — End: ?

## 2022-08-04 NOTE — Telephone Encounter
Refill request for: Celebrex  Last Office Visit: 08/02/22 AWV  Next Office Visit: n/a  Last Labs: 08/02/22    Routing to Dr. Reita Cliche for approval/denial.

## 2022-08-14 ENCOUNTER — Encounter: Admit: 2022-08-14 | Discharge: 2022-08-14 | Payer: MEDICARE

## 2022-08-14 NOTE — Telephone Encounter
Pt's PA is in process for methocarbamol medication. Attached below is the document of detailed information about the PA. Pt has been notified about the PA process.

## 2022-08-16 ENCOUNTER — Encounter: Admit: 2022-08-16 | Discharge: 2022-08-16 | Payer: MEDICARE

## 2022-08-31 ENCOUNTER — Encounter: Admit: 2022-08-31 | Discharge: 2022-08-31 | Payer: MEDICARE

## 2022-08-31 ENCOUNTER — Ambulatory Visit: Admit: 2022-08-31 | Discharge: 2022-08-31 | Payer: MEDICARE

## 2022-08-31 DIAGNOSIS — M1990 Unspecified osteoarthritis, unspecified site: Secondary | ICD-10-CM

## 2022-08-31 DIAGNOSIS — J302 Other seasonal allergic rhinitis: Secondary | ICD-10-CM

## 2022-08-31 DIAGNOSIS — K219 Gastro-esophageal reflux disease without esophagitis: Secondary | ICD-10-CM

## 2022-08-31 DIAGNOSIS — E785 Hyperlipidemia, unspecified: Secondary | ICD-10-CM

## 2022-08-31 DIAGNOSIS — M8580 Other specified disorders of bone density and structure, unspecified site: Secondary | ICD-10-CM

## 2022-08-31 DIAGNOSIS — E781 Pure hyperglyceridemia: Secondary | ICD-10-CM

## 2022-08-31 DIAGNOSIS — I495 Sick sinus syndrome: Secondary | ICD-10-CM

## 2022-08-31 DIAGNOSIS — K289 Gastrojejunal ulcer, unspecified as acute or chronic, without hemorrhage or perforation: Secondary | ICD-10-CM

## 2022-08-31 DIAGNOSIS — Z1231 Encounter for screening mammogram for malignant neoplasm of breast: Secondary | ICD-10-CM

## 2022-08-31 DIAGNOSIS — E039 Hypothyroidism, unspecified: Secondary | ICD-10-CM

## 2022-08-31 DIAGNOSIS — N390 Urinary tract infection, site not specified: Secondary | ICD-10-CM

## 2022-08-31 DIAGNOSIS — F32A Depression: Secondary | ICD-10-CM

## 2022-08-31 DIAGNOSIS — G43909 Migraine, unspecified, not intractable, without status migrainosus: Secondary | ICD-10-CM

## 2022-08-31 DIAGNOSIS — R42 Dizziness and giddiness: Secondary | ICD-10-CM

## 2022-08-31 LAB — CBC
HEMATOCRIT: 37 % (ref 36–45)
HEMOGLOBIN: 12 g/dL (ref 12.0–15.0)
MCH: 31 pg (ref 26–34)
MCHC: 33 g/dL (ref 32.0–36.0)
MCV: 95 FL (ref 80–100)
MPV: 7.7 FL (ref 7–11)
PLATELET COUNT: 322 10*3/uL (ref 150–400)
RBC COUNT: 3.9 M/UL — ABNORMAL LOW (ref 4.0–5.0)
RDW: 13 % (ref 11–15)
WBC COUNT: 4.6 10*3/uL (ref 4.5–11.0)

## 2022-08-31 LAB — COMPREHENSIVE METABOLIC PANEL
ALT: 16 U/L (ref 7–56)
ANION GAP: 11 (ref 3–12)
CO2: 27 MMOL/L (ref 21–30)
EGFR: >60 mL/min (ref >60)
POTASSIUM: 4.4 MMOL/L (ref 3.5–5.1)
SODIUM: 141 MMOL/L (ref <100)

## 2022-08-31 LAB — LIPID PROFILE
CHOLESTEROL: 117 mg/dL (ref <200)
HDL: 43 mg/dL (ref >40)
TRIGLYCERIDES: 158 mg/dL — ABNORMAL HIGH (ref <150)

## 2022-08-31 LAB — FREE T4-FREE THYROXINE: FREE T4: 1.5 ng/dL (ref 0.6–1.6)

## 2022-08-31 LAB — TSH WITH FREE T4 REFLEX: TSH: 0.1 uU/mL — ABNORMAL LOW (ref 0.35–5.00)

## 2022-08-31 LAB — 25-OH VITAMIN D (D2 + D3): VITAMIN D (25-OH) TOTAL: 49 ng/mL (ref 30–80)

## 2022-09-02 ENCOUNTER — Encounter: Admit: 2022-09-02 | Discharge: 2022-09-02 | Payer: MEDICARE

## 2022-09-02 DIAGNOSIS — I495 Sick sinus syndrome: Secondary | ICD-10-CM

## 2022-09-02 DIAGNOSIS — E781 Pure hyperglyceridemia: Secondary | ICD-10-CM

## 2022-09-02 DIAGNOSIS — J302 Other seasonal allergic rhinitis: Secondary | ICD-10-CM

## 2022-09-02 DIAGNOSIS — K289 Gastrojejunal ulcer, unspecified as acute or chronic, without hemorrhage or perforation: Secondary | ICD-10-CM

## 2022-09-02 DIAGNOSIS — G43909 Migraine, unspecified, not intractable, without status migrainosus: Secondary | ICD-10-CM

## 2022-09-02 DIAGNOSIS — E785 Hyperlipidemia, unspecified: Secondary | ICD-10-CM

## 2022-09-02 DIAGNOSIS — R42 Dizziness and giddiness: Secondary | ICD-10-CM

## 2022-09-02 DIAGNOSIS — N390 Urinary tract infection, site not specified: Secondary | ICD-10-CM

## 2022-09-02 DIAGNOSIS — K219 Gastro-esophageal reflux disease without esophagitis: Secondary | ICD-10-CM

## 2022-09-02 DIAGNOSIS — F32A Depression: Secondary | ICD-10-CM

## 2022-09-02 DIAGNOSIS — M1990 Unspecified osteoarthritis, unspecified site: Secondary | ICD-10-CM

## 2022-09-02 DIAGNOSIS — E039 Hypothyroidism, unspecified: Secondary | ICD-10-CM

## 2022-09-04 ENCOUNTER — Encounter: Admit: 2022-09-04 | Discharge: 2022-09-04 | Payer: MEDICARE

## 2022-09-04 DIAGNOSIS — E039 Hypothyroidism, unspecified: Secondary | ICD-10-CM

## 2022-09-04 MED ORDER — LEVOTHYROXINE 137 MCG PO TAB
137 ug | ORAL_TABLET | Freq: Every day | ORAL | 3 refills | 30.00000 days | Status: AC
Start: 2022-09-04 — End: ?

## 2022-10-10 ENCOUNTER — Encounter: Admit: 2022-10-10 | Discharge: 2022-10-10 | Payer: MEDICARE

## 2022-10-10 MED ORDER — ESOMEPRAZOLE MAGNESIUM 40 MG PO CPDR
ORAL_CAPSULE | 3 refills | 30.00000 days | Status: AC
Start: 2022-10-10 — End: ?

## 2022-10-10 MED ORDER — DICYCLOMINE 10 MG PO CAP
10 mg | ORAL_CAPSULE | Freq: Two times a day (BID) | ORAL | 3 refills | Status: AC
Start: 2022-10-10 — End: ?

## 2022-10-10 MED ORDER — AMLODIPINE 10 MG PO TAB
ORAL_TABLET | 3 refills | Status: AC
Start: 2022-10-10 — End: ?

## 2022-10-10 MED ORDER — FAMOTIDINE 40 MG PO TAB
40 mg | ORAL_TABLET | Freq: Every day | ORAL | 3 refills | 90.00000 days | Status: AC
Start: 2022-10-10 — End: ?

## 2022-10-10 MED ORDER — ICOSAPENT ETHYL 1 GRAM PO CAP
2 g | ORAL_CAPSULE | Freq: Two times a day (BID) | ORAL | 3 refills | Status: AC
Start: 2022-10-10 — End: ?

## 2022-11-14 ENCOUNTER — Encounter: Admit: 2022-11-14 | Discharge: 2022-11-14 | Payer: MEDICARE

## 2022-11-14 ENCOUNTER — Ambulatory Visit: Admit: 2022-11-14 | Discharge: 2022-11-14 | Payer: MEDICARE

## 2022-11-14 DIAGNOSIS — R300 Dysuria: Secondary | ICD-10-CM

## 2022-11-14 DIAGNOSIS — E039 Hypothyroidism, unspecified: Secondary | ICD-10-CM

## 2022-11-14 MED ORDER — CEPHALEXIN 500 MG PO CAP
500 mg | ORAL_CAPSULE | Freq: Three times a day (TID) | ORAL | 0 refills | Status: AC
Start: 2022-11-14 — End: ?

## 2022-11-14 NOTE — Telephone Encounter
Reason for Disposition   Age > 50 years    Answer Assessment - Initial Assessment Questions  Pt noted she started experiencing pain with urination, urinary frequency, urgency and cloudy urine on Sunday 9/29. Pt increased her hydration to help with symptoms. Symptoms have worsened, no new symptoms have developed. Pt denies blood in urine, difficulty urination, or fevers. Pain with urination is a 7-8/10. Per Protocol, recommended pt schedule an OV with NP in office, pt declined. Recommended pt be evaluated at [facility]. Pt declined. Pt requesting to have a lab ordered by pcp and she can complete this afternoon. Pt given care advice, pt verbalized understanding. Routed call to pcp per pt's request.     1. SEVERITY: How bad is the pain?  (e.g., Scale 1-10; mild, moderate, or severe)    - MILD (1-3): complains slightly about urination hurting    - MODERATE (4-7): interferes with normal activities      - SEVERE (8-10): excruciating, unwilling or unable to urinate because of the pain       7-8/10 when urinating  2. FREQUENCY: How many times have you had painful urination today?       Pt has been hydrating well, and with urinary urgency, has urinated quite a few times today. Pt noted she got up approximately 6 times in the night to urinate.  3. PATTERN: Is pain present every time you urinate or just sometimes?       It is now present every time with urination.  4. ONSET: When did the painful urination start?       Initially started on Sunday, 9/29 it has now worsened   5. FEVER: Do you have a fever? If Yes, ask: What is your temperature, how was it measured, and when did it start?      Denies  6. PAST UTI: Have you had a urine infection before? If Yes, ask: When was the last time? and What happened that time?       It has been a long time, pt does not remember  7. CAUSE: What do you think is causing the painful urination?  (e.g., UTI, scratch, Herpes sore)      Unknown  8. OTHER SYMPTOMS: Do you have any other symptoms? (e.g., flank pain, vaginal discharge, genital sores, urgency, blood in urine)      Urinary frequency and urgency  9. PREGNANCY: Is there any chance you are pregnant? When was your last menstrual period?      No    Answer Assessment - Initial Assessment Questions  *No Answer*    Protocols used: Urinary Symptoms-A-OH, Urination Pain - Female-A-OH

## 2022-11-14 NOTE — Telephone Encounter
Patient left message stating she has a UTI and wondering if she needs to be seen or can get some medicine.

## 2022-11-14 NOTE — Telephone Encounter
Attempted to call pt. LM on cell- signed permission on file regarding urine test orders placed- can come by the lab to drop off a urine sample sometime today before 5 PM.  Basilio Cairo, RN

## 2022-11-14 NOTE — Telephone Encounter
Urine testing ordered

## 2022-11-15 LAB — URINALYSIS DIPSTICK
GLUCOSE,UA: NEGATIVE
NITRITE: POSITIVE — AB
PROTEIN,UA: NEGATIVE
URINE ASCORBIC ACID, UA: POSITIVE — AB
URINE BILE: NEGATIVE
URINE BLOOD: NEGATIVE
URINE KETONE: NEGATIVE
URINE PH: 5 (ref 5.0–8.0)
URINE SPEC GRAVITY: 1 (ref 1.005–1.030)

## 2022-11-15 LAB — TSH WITH FREE T4 REFLEX: TSH: 2.4 uU/mL (ref 0.35–5.00)

## 2022-11-15 LAB — URINALYSIS, MICROSCOPIC

## 2022-11-18 ENCOUNTER — Encounter: Admit: 2022-11-18 | Discharge: 2022-11-18 | Payer: MEDICARE

## 2022-11-18 MED ORDER — NITROFURANTOIN MONOHYD/M-CRYST 100 MG PO CAP
100 mg | ORAL_CAPSULE | Freq: Two times a day (BID) | ORAL | 0 refills | 7.00000 days | Status: AC
Start: 2022-11-18 — End: ?

## 2022-11-19 ENCOUNTER — Encounter: Admit: 2022-11-19 | Discharge: 2022-11-19 | Payer: MEDICARE

## 2022-11-19 DIAGNOSIS — R3129 Other microscopic hematuria: Secondary | ICD-10-CM

## 2022-11-21 ENCOUNTER — Encounter: Admit: 2022-11-21 | Discharge: 2022-11-21 | Payer: MEDICARE

## 2022-11-23 ENCOUNTER — Encounter: Admit: 2022-11-23 | Discharge: 2022-11-23 | Payer: MEDICARE

## 2022-11-24 ENCOUNTER — Ambulatory Visit: Admit: 2022-11-24 | Discharge: 2022-11-24 | Payer: MEDICARE

## 2022-11-24 ENCOUNTER — Encounter: Admit: 2022-11-24 | Discharge: 2022-11-24 | Payer: MEDICARE

## 2022-11-24 DIAGNOSIS — S29011A Strain of muscle and tendon of front wall of thorax, initial encounter: Secondary | ICD-10-CM

## 2022-11-24 NOTE — Patient Instructions
Tonya Williamson,  The x-ray shows only the old rib fractures on the left.    No evidence of new rib fractures or any lung injury    Watch out for worsening symptoms if increasing pain or shortness of breath and please be seen again.

## 2022-11-24 NOTE — Telephone Encounter
Pt LVM - has fallen, having rib pain

## 2022-11-24 NOTE — Progress Notes
Date of Service: 11/24/2022    Tonya Williamson is a 77 y.o. female.  DOB: 25-Sep-1945  MRN: [id number]     Subjective:             History of Present Illness  Pleasant 77 year old here in no distress but needs to be checked after a fall [place] her [place] yesterday.  Her husband is just home from the hospital sister surgery she is the main caretaker    She did not have any acute shortness of breath high fever chills hemoptysis.  She points to the left costochondral area and also to some degree of the left mid axillary line, lower rib cage but there is no obvious swelling or deformity but there is some tenderness in these areas.  Respirations are unlabored her lungs are clear the rate is regular       Review of Systems   Constitutional:  Positive for activity change. Negative for chills and fever.   Respiratory:  Negative for cough and shortness of breath.    Gastrointestinal:  Negative for abdominal pain, diarrhea and vomiting.   Genitourinary:  Negative for hematuria.   Musculoskeletal:  Positive for myalgias. Negative for back pain and neck pain.   Allergic/Immunologic: Negative for immunocompromised state.   Neurological:  Negative for weakness.   Psychiatric/Behavioral:  Negative for agitation and confusion.    She also mildly bruised her right knee but really essentially normal exam today.  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  Problem list and medication list reviewed.  Objective:          acetaminophen SR (TYLENOL) 650 mg tablet Take two tablets by mouth twice daily.    amitriptyline (ELAVIL) 25 mg tablet TAKE 1 TABLET BY MOUTH DAILY at bedtime    amLODIPine (NORVASC) 10 mg tablet TAKE 1 TABLET BY MOUTH DAILY    atorvastatin (LIPITOR) 80 mg tablet take 1/2 tablet BY MOUTH EVERY EVENING    CALCIUM CARBONATE/VITAMIN D3 (CALCIUM + D PO) Take 1 tablet by mouth daily.    carvediloL (COREG) 3.125 mg tablet Take one tablet by mouth twice daily. TAKE WITH FOOD.    celecoxib (CELEBREX) 200 mg capsule TAKE 1 CAPSULE BY MOUTH EVERY DAY    clobetasoL (TEMOVATE) 0.05 % topical cream Apply  topically to affected area twice daily.    dicyclomine (BENTYL) 10 mg capsule take one capsule by mouth twice daily    duloxetine DR (CYMBALTA) 60 mg capsule TAKE 1 CAPSULE BY MOUTH DAILY    esomeprazole DR (NEXIUM) 40 mg capsule TAKE 1 TABLET BY MOUTH TWICE DAILY. Take ON an EMPTY stomach AT least 1 hour BEFORE OR 2 hours AFTER meals.    estradioL (ESTRACE) 0.5 mg tablet Take 1.5 tablets by mouth daily.    ezetimibe (ZETIA) 10 mg tablet Take one tablet by mouth daily.    famotidine (PEPCID) 40 mg tablet TAKE 1 TABLET BY MOUTH DAILY    fluconazole (DIFLUCAN) 150 mg tablet Take 1 dose for yeast infection repeat in 2-3 days if needed    fluticasone propionate (FLONASE) 50 mcg/actuation nasal spray, suspension USE 2 SPRAYS in each nostril AS DIRECTED DAILY. shake BEFORE USE    icosapent ethyL (VASCEPA) 1 gram capsule take 2 capsules BY MOUTH TWICE DAILY WITH FOOD    levothyroxine (LEVOXYL) 137 mcg tablet Take one tablet by mouth daily 30 minutes before breakfast.    lisinopriL (ZESTRIL) 40 mg tablet Take one tablet by mouth daily.    methocarbamoL (ROBAXIN)  500 mg tablet TAKE ONE TABLET TO TWO TABLETS BY MOUTH FOUR TIMES DAILY AS NEEDED FOR SPASMS.    mirabegron (MYRBETRIQ) 25 mg ER tablet Take one tablet by mouth daily.    MULTIVITAMIN PO Take 1 tablet by mouth daily.    nitrofurantoin monohyd/m-cryst (MACROBID) 100 mg capsule Take one capsule by mouth every 12 hours for 7 days. Take with food.  Indications: genitourinary tract infections     Vitals:    11/24/22 1234   BP: (!) 149/79   BP Source: Arm, Right Upper   Pulse: 64   Temp: 36.8 ?C (98.3 ?F)   Resp: 16   SpO2: 96%   PainSc: Six   Weight: 73.9 kg (163 lb)   Height: 162.6 cm (5' 4)     Body mass index is 27.98 kg/m?Marland Kitchen     Physical Exam  Constitutional:       General: She is not in acute distress.     Appearance: She is not ill-appearing, toxic-appearing or diaphoretic. Cardiovascular:      Rate and Rhythm: Regular rhythm.      Heart sounds: Normal heart sounds.      No friction rub.   Pulmonary:      Effort: Pulmonary effort is normal. No respiratory distress.      Breath sounds: Normal breath sounds.   Abdominal:      Tenderness: There is no abdominal tenderness. There is no guarding.   Neurological:      Mental Status: She is oriented to person, place, and time.   Psychiatric:         Behavior: Behavior normal.   Pleasant good spirits looks well.  No distress.  Lungs are clear.  Respiratory rate 10.  Tenderness without deformity or swelling at the costochondral area mid left  Mild tenderness without marked pain or swelling or deformity at the left mid axillary line lower rib cage on the left.  There is no rub or pallor or jaundice there is no abdominal pain any other areas of pain or swelling or deformity.  Neck is supple she is in good spirits in no distress.       Assessment and Plan:  Tonya Williamson was seen today for fall.    Diagnoses and all orders for this visit:    Muscle strain of chest wall, initial encounter  -     RIBS UNI LEFT W CHEST MIN 3V; Future; Expected date: 11/24/2022    X-ray over read agrees no obvious fracture.  Old fractures on the left    Conservative care Tylenol and or ibuprofen.  She is already very active and very unlikely to develop pneumonia but she understands to stay active ,take deep breaths.  She agrees to plan dismissed generally well-appearing in no distress.  PCP follow-up as needed

## 2022-11-24 NOTE — Telephone Encounter
Person Calling (Patient, Partner, Friend, etc.): Patient    DOB: [birth year]    Reason for Call: left side rib pain    Onset of Symptoms: 11/19/22    Summary:  11/19/22 I was moving the water sprinkler in the yard and slipped on some wet river rock we have. Landed on my left side. I think I hit my left ribs on a big decorative rock we have. Also have bruising on right knee.  Since the fall I've had left side rib pain when breathing, moving, but my husband just got out of [place] yesterday, so I've been busy with him.  Pt denies lacerations, sob, lightheadedness/dizziness, head injury, or LOC.     Disposition Given: OV today.     Following Disposition (Yes or No): Yes, pt will go to [facility] since there are no appts available in Provider office today.      Reason for Disposition   Patient has a concerning injury to a specific part of the body (e.g., chest, leg, head)   [1] MODERATE pain (e.g., interferes with normal activities) AND [2] high-risk adult (e.g., age > 60 years, osteoporosis, chronic steroid use)    Answer Assessment - Initial Assessment Questions  1. MECHANISM: How did the fall happen?      11/19/22 I was moving the water sprinkler in the yard and slipped on some wet river rock we have. Landed on my left side. I think I hit my left ribs on a big decorative rock we have. Also have bruising on right knee.  Since the fall I've had left side rib pain when breathing, moving, but my husband just got out of [place] yesterday, so I've been busy with him.     2. DOMESTIC VIOLENCE AND ELDER ABUSE SCREENING: Did you fall because someone pushed you or tried to hurt you? If Yes, ask: Are you safe now?      Denies.     3. ONSET: When did the fall happen? (e.g., minutes, hours, or days ago)      11/19/22  4. LOCATION: What part of the body hit the ground? (e.g., back, buttocks, head, hips, knees, hands, head, stomach)      Left ribs, left side of left breast, left leg.     5. INJURY: Did you hurt (injure) yourself when you fell? If Yes, ask: What did you injure? Tell me more about this? (e.g., body area; type of injury; pain severity)      Rib pain since fall.     6. PAIN: Is there any pain? If Yes, ask: How bad is the pain? (e.g., Scale 1-10; or mild,   moderate, severe)    - NONE (0): No pain    - MILD (1-3): Doesn't interfere with normal activities     - MODERATE (4-7): Interferes with normal activities or awakens from sleep     - SEVERE (8-10): Excruciating pain, unable to do any normal activities       Moderate.     7. SIZE: For cuts, bruises, or swelling, ask: How large is it? (e.g., inches or centimeters)       Bruising    8. PREGNANCY: Is there any chance you are pregnant? When was your last menstrual period?      NA.     9. OTHER SYMPTOMS: Do you have any other symptoms? (e.g., dizziness, fever, weakness; new onset or worsening).       Denies weakness, dizziness, sob.     10. CAUSE:  What do you think caused the fall (or falling)? (e.g., tripped, dizzy spell)        Slipped on wet river rock.    Answer Assessment - Initial Assessment Questions  1. MECHANISM: How did the injury happen?       11/19/22 I was moving the water sprinkler in the yard and slipped on some wet river rock we have. Landed on my left side. I think I hit my left ribs on a big decorative rock we have. Also have bruising on right knee.  Since the fall I've had left side rib pain when breathing, moving, but my husband just got out of [place] yesterday, so I've been busy with him.     2. ONSET: When did the injury happen? (e.g., minutes, hours, days ago)      11/19/22    3. LOCATION: Where on the chest is the injury located? Where does it hurt?      Left side rib pain.     4. CHEST OR RIB PAIN SEVERITY: How bad is the pain?  (e.g., Scale 1-10; mild, moderate, or severe)     - MILD (1-3): doesn't interfere with normal activities      - MODERATE (4-7): interferes with normal activities or awakens from sleep     - SEVERE (8-10): excruciating pain, unable to do any normal activities        Moderate.     5. BREATHING DIFFICULTY: Are you having any difficulty breathing? If Yes, ask: How bad is it?  (e.g., none, mild, moderate, severe)   6: OTHER SYMPTOMS (e.g., cough, fever, rash)       Hurts to take deep breaths, go from sitting to standing, lay on left side.     7. PREGNANCY: Is there any chance you are pregnant? When was your last menstrual period?      *No Answer*    Protocols used: Falls and Falling-A-OH, Chest Injury - Bending, Lifting, or Twisting-A-AH

## 2022-12-25 ENCOUNTER — Encounter: Admit: 2022-12-25 | Discharge: 2022-12-25 | Payer: MEDICARE

## 2022-12-25 MED ORDER — AMOXICILLIN-POT CLAVULANATE 875-125 MG PO TAB
1 | ORAL_TABLET | Freq: Two times a day (BID) | ORAL | 0 refills | 7.00000 days | Status: AC
Start: 2022-12-25 — End: ?

## 2022-12-25 NOTE — Telephone Encounter
Reports sinus pain x a few days requesting abx. LVM [id number].

## 2022-12-25 NOTE — Telephone Encounter
Pt read the [facility] message. Last read by Harriet Pho at  1:44 PM on 12/25/2022.   Basilio Cairo, RN

## 2022-12-25 NOTE — Telephone Encounter
Sent augmentin to her pharmacy  

## 2022-12-25 NOTE — Telephone Encounter
Pt notified via MyChart message.  Basilio Cairo, RN

## 2022-12-25 NOTE — Telephone Encounter
Person Calling (Patient, Partner, Friend, etc.): self    Permission to Communicate on File Yes}    Tonya Williamson    10/12/1945    Reason for Call: sinus pain    Summary: Onset of Symptoms: Pt reports sinus pain/congestion and headache x a few days. Pt reports photosensitivity r/t pressure in head. Pt reports hx of recurrent sinus infections and reports symptoms feel similar. Pt denies fevers.     Disposition: Pt requesting to see if PCP can prescribe abx for sinus infection without office visit. Pt also requesting that if abx are prescribed for diflucan to be prescribed as well to prevent yeast infection from abx.     Following Disposition  No, wishes to speak to pcp    No future appointments.   Reason for Disposition   Sinus congestion as part of a cold, present < 10 days    Answer Assessment - Initial Assessment Questions  1. LOCATION: Where does it hurt?       Between eyebrows, cheeks  2. ONSET: When did the sinus pain start?  (e.g., hours, days)       X a few days   3. SEVERITY: How bad is the pain?   (Scale 1-10; mild, moderate or severe)    - MILD (1-3): doesn't interfere with normal activities     - MODERATE (4-7): interferes with normal activities (e.g., work or school) or awakens from sleep    - SEVERE (8-10): excruciating pain and patient unable to do any normal activities         moderate  4. RECURRENT SYMPTOM: Have you ever had sinus problems before? If Yes, ask: When was the last time? and What happened that time?       Hx of recurrent sinus infections  5. NASAL CONGESTION: Is the nose blocked? If Yes, ask: Can you open it or must you breathe through your mouth?      Can breathe through nose  6. NASAL DISCHARGE: Do you have discharge from your nose? If so ask, What color?      yes  7. FEVER: Do you have a fever? If Yes, ask: What is it, how was it measured, and when did it start?       Denies  8. OTHER SYMPTOMS: Do you have any other symptoms? (e.g., sore throat, cough, earache, difficulty breathing)      Productive cough with green sputum, intermittent sore throat  9. PREGNANCY: Is there any chance you are pregnant? When was your last menstrual period?      NA    Protocols used: Sinus Pain or Congestion-A-OH

## 2023-01-09 ENCOUNTER — Encounter: Admit: 2023-01-09 | Discharge: 2023-01-09 | Payer: MEDICARE

## 2023-01-09 MED ORDER — EZETIMIBE 10 MG PO TAB
10 mg | ORAL_TABLET | Freq: Every day | ORAL | 1 refills | Status: AC
Start: 2023-01-09 — End: ?

## 2023-01-09 MED ORDER — ESTRADIOL 0.5 MG PO TAB
0.75 mg | ORAL_TABLET | Freq: Every day | ORAL | 1 refills | 30.00000 days | Status: AC
Start: 2023-01-09 — End: ?

## 2023-01-09 MED ORDER — LISINOPRIL 40 MG PO TAB
40 mg | ORAL_TABLET | Freq: Every day | ORAL | 1 refills | Status: AC
Start: 2023-01-09 — End: ?

## 2023-01-09 MED ORDER — CARVEDILOL 3.125 MG PO TAB
3.125 mg | ORAL_TABLET | Freq: Two times a day (BID) | ORAL | 1 refills | 90.00000 days | Status: AC
Start: 2023-01-09 — End: ?

## 2023-01-09 MED ORDER — ATORVASTATIN 80 MG PO TAB
ORAL_TABLET | 1 refills | Status: AC
Start: 2023-01-09 — End: ?

## 2023-01-23 ENCOUNTER — Encounter: Admit: 2023-01-23 | Discharge: 2023-01-23 | Payer: MEDICARE

## 2023-01-23 NOTE — Telephone Encounter
Person Calling (Patient, Partner, Friend, etc.): pt    Permission to Communicate on File N/A    Tonya Williamson    26-Sep-1945    Chief Complaint   Patient presents with    Congestion        Onset of Symptoms: since finishing antibiotics that were prescribed 11/11 for 10 day cours    Summary: Pt was given antibiotics 11/11 for sinus infection. Pt reports since finishing course she has had intermittent headaches. Reports headaches are across forehead and back of head. Reports headaches can be all day long or sometimes just part of the day. Rates headache 7/10 now. Reports the cold air hurts her head. Denies history of headaches or head injury. Reports head pounding feeling like its going to burst. Reports fatigue. Reports blood pressure is 131/80, HR 84. Denies additional neurological symptoms: numbness/weakness, vision changes, speech changes, facial asymmetry. Disposition see in office today vs tomorrow. Appt made with NP on 12/11 at 1130. Pt given home care advice and verbalizes understanding. Krames education sent. Denies any further questions at this time. Advised on ED/UC precautions. Pt instructed to call back with any further requests or needs.       Disposition: see in office today vs tomorrow    Following Disposition: yes      Reason for Disposition   Unexplained headache that is present > 24 hours    Answer Assessment - Initial Assessment Questions  1. LOCATION: Where does it hurt?       Across forehead and back of head  2. ONSET: When did the headache start? (Minutes, hours or days)       Since antibiotics started 11/11  3. PATTERN: Does the pain come and go, or has it been constant since it started?      Might be all day long or sometimes part of the day  4. SEVERITY: How bad is the pain? and What does it keep you from doing?  (e.g., Scale 1-10; mild, moderate, or severe)    - MILD (1-3): doesn't interfere with normal activities     - MODERATE (4-7): interferes with normal activities or awakens from sleep     - SEVERE (8-10): excruciating pain, unable to do any normal activities         Headache now: 7/10  5. RECURRENT SYMPTOM: Have you ever had headaches before? If Yes, ask: When was the last time? and What happened that time?       denies  6. CAUSE: What do you think is causing the headache?      unsure  7. MIGRAINE: Have you been diagnosed with migraine headaches? If Yes, ask: Is this headache similar?       denies  8. HEAD INJURY: Has there been any recent injury to the head?       denies  9. OTHER SYMPTOMS: Do you have any other symptoms? (fever, stiff neck, eye pain, sore throat, cold symptoms)      reports head pounding like it was going to burst. Denies eye pain. Denies neck stiffness. Fatigue. Denies cold symptoms  10. PREGNANCY: Is there any chance you are pregnant? When was your last menstrual period?        N/a  Blood pressure this morning 131/80, pulse 84  Take tylenol arthritis 2 in am/night, would think that would help with a headache  Cold air makes head hurt    Protocols used: Headache-A-OH

## 2023-01-23 NOTE — Telephone Encounter
Patient Tonya Williamson, reports if she exerts herself, her head starts hurting and she feels exhausted. Thought at first she had a sinus infection but now not sure. Would like appointment with PCP

## 2023-01-24 ENCOUNTER — Encounter: Admit: 2023-01-24 | Discharge: 2023-01-24 | Payer: MEDICARE

## 2023-01-24 ENCOUNTER — Ambulatory Visit: Admit: 2023-01-24 | Discharge: 2023-01-24 | Payer: MEDICARE

## 2023-01-24 MED ORDER — PREDNISONE 20 MG PO TAB
40 mg | ORAL_TABLET | Freq: Every day | ORAL | 0 refills | Status: AC
Start: 2023-01-24 — End: ?

## 2023-01-24 MED ORDER — KETOROLAC 30 MG/ML (1 ML) IJ SOLN
30 mg | Freq: Once | INTRAMUSCULAR | 0 refills | Status: CN
Start: 2023-01-24 — End: ?

## 2023-01-24 NOTE — Patient Instructions
--   start prednisone 40 mg in AM with food x 5 days  -- if headache not better message me on MyChart and I will order CT sinuses to evaluate further  -- may consider ENT referral   -- continue Allegra and Flonase  -- follow up as needed

## 2023-01-24 NOTE — Progress Notes
Date of Service: 01/24/2023    Subjective            Chief Complaint   Patient presents with    Congestion         Onset of Symptoms: since finishing antibiotics that were prescribed 11/11 for 10 day cours     Summary: Pt was given antibiotics 11/11 for sinus infection. Pt reports since finishing course she has had intermittent headaches. Reports headaches are across forehead and back of head. Reports headaches can be all day long or sometimes just part of the day. Rates headache 7/10 now. Reports the cold air hurts her head. Denies history of headaches or head injury. Reports head pounding feeling like its going to burst. Reports fatigue. Reports blood pressure is 131/80, HR 84. Denies additional neurological symptoms: numbness/weakness, vision changes, speech changes, facial asymmetry. Disposition see in office today vs tomorrow. Appt made with NP on 12/11 at 1130. Pt given home care advice and verbalizes understanding. Krames education sent. Denies any further questions at this time. Advised on ED/UC precautions. Pt instructed to call back with any further requests or needs.         Disposition: see in office today vs tomorrow    Following Disposition: yes        Reason for Disposition   Unexplained headache that is present > 24 hours    Answer Assessment - Initial Assessment Questions  1. LOCATION: Where does it hurt?       Across forehead and back of head  2. ONSET: When did the headache start? (Minutes, hours or days)       Since antibiotics started 11/11  3. PATTERN: Does the pain come and go, or has it been constant since it started?      Might be all day long or sometimes part of the day  4. SEVERITY: How bad is the pain? and What does it keep you from doing?  (e.g., Scale 1-10; mild, moderate, or severe)    - MILD (1-3): doesn't interfere with normal activities     - MODERATE (4-7): interferes with normal activities or awakens from sleep     - SEVERE (8-10): excruciating pain, unable to do any normal activities         Headache now: 7/10  5. RECURRENT SYMPTOM: Have you ever had headaches before? If Yes, ask: When was the last time? and What happened that time?       denies  6. CAUSE: What do you think is causing the headache?      unsure  7. MIGRAINE: Have you been diagnosed with migraine headaches? If Yes, ask: Is this headache similar?       denies  8. HEAD INJURY: Has there been any recent injury to the head?       denies  9. OTHER SYMPTOMS: Do you have any other symptoms? (fever, stiff neck, eye pain, sore throat, cold symptoms)      reports head pounding like it was going to burst. Denies eye pain. Denies neck stiffness. Fatigue. Denies cold symptoms  10. PREGNANCY: Is there any chance you are pregnant? When was your last menstrual period?        N/a  Blood pressure this morning 131/80, pulse 84  Take tylenol arthritis 2 in am/night, would think that would help with a headache  Cold air makes head hurt    Protocols used: Headache-A-OH    Patient Reported Other  What topic(s) would you like to cover  during your appointment?:  Headaches and forgetfullness.  Please describe the issue(s) and history with the issue (location, severity, duration, symptoms, etc.).:  Headaches  What has been done so far to take care of the issue(s)?:  Headache over the concounter meds  What are your goals for this visit?:  Medication or other corrections.        Tonya Williamson is a very pleasant 77 y.o. female patient of Tonya Williamson who presents today for:    Chief Complaint   Patient presents with    Headache     Headache   Did abt about 4 weeks ago  Headache won't go away   Having some forgetfulness  Noticed it for about 6 months    Headache is sometimes bad   Others just kinda there  But hasn't gone away     Forgetfulness  X 6 months  Loses track of her thoughts mid conversation and word finding  Headache  12/25/22 diagnosis sinusitis and headache onset at that time. She was treated with Augmentin. Other symptoms resolved, only headache remains.   Location- across forehead and back of head  Last several hours to all day at moderate to severe level with intense throbbing sensation associated with fatigue  Pain exacerbated by cold air  Denies history of headaches or head injury.   Denies numbness/weakness, vision changes, speech changes, facial asymmetry.   No N/V, light sensitivity, noise sensitivity   Treatment-  Tylenol Arthritis 1300 mg AM/PM, rest, allergy med  Does have GERD and takes Nexium twice daily  Sees Pain Mgmt and will use Methocarbamol is having neck tension and feels it gives her a better night of sleep              ROS  (see HPI)    Objective       Vitals:    01/24/23 1141   BP: 120/78   BP Source: Arm, Left Upper   Pulse: 68   Temp: 36.6 ?C (97.9 ?F)   Resp: 14   SpO2: 96%   TempSrc: Temporal   PainSc: Five   Weight: 75.9 kg (167 lb 4 oz)     Body mass index is 28.71 kg/m?Marland Kitchen      acetaminophen SR (TYLENOL) 650 mg tablet Take two tablets by mouth twice daily.    amitriptyline (ELAVIL) 25 mg tablet TAKE 1 TABLET BY MOUTH DAILY at bedtime    amLODIPine (NORVASC) 10 mg tablet TAKE 1 TABLET BY MOUTH DAILY    atorvastatin (LIPITOR) 80 mg tablet take 1/2 tablet BY MOUTH EVERY EVENING    CALCIUM CARBONATE/VITAMIN D3 (CALCIUM + D PO) Take 1 tablet by mouth daily.    carvediloL (COREG) 3.125 mg tablet TAKE 1 TABLET BY MOUTH TWICE DAILY. take with food.    celecoxib (CELEBREX) 200 mg capsule TAKE 1 CAPSULE BY MOUTH EVERY DAY    clobetasoL (TEMOVATE) 0.05 % topical cream Apply  topically to affected area twice daily.    dicyclomine (BENTYL) 10 mg capsule take one capsule by mouth twice daily    duloxetine DR (CYMBALTA) 60 mg capsule TAKE 1 CAPSULE BY MOUTH DAILY    esomeprazole DR (NEXIUM) 40 mg capsule TAKE 1 TABLET BY MOUTH TWICE DAILY. Take ON an EMPTY stomach AT least 1 hour BEFORE OR 2 hours AFTER meals.    estradioL (ESTRACE) 0.5 mg tablet take 1 AND 1/2 tablets BY MOUTH DAILY    ezetimibe (ZETIA) 10 mg tablet TAKE 1 TABLET BY MOUTH DAILY  fluconazole (DIFLUCAN) 150 mg tablet Take 1 dose for yeast infection repeat in 2-3 days if needed (Patient not taking: Reported on 01/24/2023)    fluticasone propionate (FLONASE) 50 mcg/actuation nasal spray, suspension USE 2 SPRAYS in each nostril AS DIRECTED DAILY. shake BEFORE USE    icosapent ethyL (VASCEPA) 1 gram capsule take 2 capsules BY MOUTH TWICE DAILY WITH FOOD    levothyroxine (LEVOXYL) 137 mcg tablet Take one tablet by mouth daily 30 minutes before breakfast.    lisinopriL (ZESTRIL) 40 mg tablet TAKE 1 TABLET BY MOUTH DAILY    methocarbamoL (ROBAXIN) 500 mg tablet TAKE ONE TABLET TO TWO TABLETS BY MOUTH FOUR TIMES DAILY AS NEEDED FOR SPASMS.    mirabegron (MYRBETRIQ) 25 mg ER tablet Take one tablet by mouth daily.    MULTIVITAMIN PO Take 1 tablet by mouth daily.    predniSONE (DELTASONE) 20 mg tablet Take two tablets by mouth daily for 5 days.     Past Medical History:   Diagnosis Date    Arthritis     Depression     Dizziness     GERD (gastroesophageal reflux disease)     HTN     Hyperlipidemia     Hypertriglyceridemia 2005    Hypothyroid     Migraine     Seasonal allergic reaction     SSS (sick sinus syndrome) (HCC) 2006    Ulcer of the stomach and intestine     Urinary tract infection        Physical Exam  Vitals reviewed.   Constitutional:       Appearance: Normal appearance.   HENT:      Head: Normocephalic.      Right Ear: Tympanic membrane normal.      Left Ear: Tympanic membrane normal.      Nose: No congestion.      Mouth/Throat:      Mouth: Mucous membranes are moist.      Pharynx: No posterior oropharyngeal erythema.   Eyes:      Pupils: Pupils are equal, round, and reactive to light.   Cardiovascular:      Rate and Rhythm: Normal rate and regular rhythm.      Heart sounds: Normal heart sounds.   Pulmonary:      Effort: Pulmonary effort is normal.      Breath sounds: Normal breath sounds.   Musculoskeletal:         General: Normal range of motion.   Skin:     General: Skin is warm and dry.   Neurological:      General: No focal deficit present.      Mental Status: She is alert and oriented to person, place, and time.              Assessment and Plan     Tonya Williamson was seen today for headache.    Diagnoses and all orders for this visit:    Acute non intractable tension-type headache  New issues today. No red flag symptoms. Suspect tension type headache from recent sinusitis. Will attempt prednisone burst and if symptoms persist will plan on CT sinuses, poss ENT referral to see if uncontrolled GERD is contributory.   Other orders  -     FLU VACCINE =>65 YO HIGH-DOSE TRIVALENT PF  -     predniSONE (DELTASONE) 20 mg tablet; Take two tablets by mouth daily for 5 days.        Patient Instructions   -- start  prednisone 40 mg in AM with food x 5 days  -- if headache not better message me on MyChart and I will order CT sinuses to evaluate further  -- may consider ENT referral   -- continue Allegra and Flonase  -- follow up as needed

## 2023-01-24 NOTE — Progress Notes
High Dose influenza administered into right deltoid. Pt tolerated well. Educated on massaging area, for comfort and healing.  VIS given to patient.   Sherron Ales LPN

## 2023-01-25 DIAGNOSIS — Z23 Encounter for immunization: Secondary | ICD-10-CM

## 2023-01-25 DIAGNOSIS — G44209 Tension-type headache, unspecified, not intractable: Secondary | ICD-10-CM

## 2023-02-05 ENCOUNTER — Encounter: Admit: 2023-02-05 | Discharge: 2023-02-05 | Payer: MEDICARE

## 2023-02-05 MED ORDER — CELECOXIB 200 MG PO CAP
200 mg | ORAL_CAPSULE | Freq: Every day | ORAL | 0 refills | Status: AC
Start: 2023-02-05 — End: ?

## 2023-02-05 NOTE — Telephone Encounter
Refill request for: celecoxib  Last Office Visit: 01/24/23 OV  Next Office Visit: 02/12/23 OV  Last Labs: 08/31/22    Routing to Dr. Reita Cliche for approval/denial.

## 2023-02-12 ENCOUNTER — Ambulatory Visit: Admit: 2023-02-12 | Discharge: 2023-02-12 | Payer: MEDICARE

## 2023-02-12 ENCOUNTER — Encounter: Admit: 2023-02-12 | Discharge: 2023-02-12 | Payer: MEDICARE

## 2023-02-12 DIAGNOSIS — E039 Hypothyroidism, unspecified: Secondary | ICD-10-CM

## 2023-02-12 DIAGNOSIS — I10 Essential (primary) hypertension: Secondary | ICD-10-CM

## 2023-02-12 DIAGNOSIS — E785 Hyperlipidemia, unspecified: Secondary | ICD-10-CM

## 2023-02-12 DIAGNOSIS — Z780 Asymptomatic menopausal state: Secondary | ICD-10-CM

## 2023-02-12 NOTE — Patient Instructions
Could consider an RSV vaccine at your pharmacy  Can go either way on a prevnar 20 or 21

## 2023-02-12 NOTE — Progress Notes
Date of Service: 02/12/2023    Tonya Williamson is a 77 y.o. female. DOB: [birth year]   MRN#: [id number]    Subjective:       77 yo female here for f/u of medical issues  Having frontal headaches and neck pain  Would like to taper off of her estradiol and see how she does  Pt had a med review with her pharmacist - would like to review her med list  Hx of OAB- htn- hyperlipidemia- hypothyroidism- GERD- CAD  Reviewed hx in epic briefly      Patient Reported Other  What topic(s) would you like to cover during your appointment?:  Review medication  Please describe the issue(s) and history with the issue (location, severity, duration, symptoms, etc.).:  Review medications with doctor  What has been done so far to take care of the issue(s)?:  Review meds  What are your goals for this visit?:  Review meds           Review of Systems   HENT:          Took augmentin in November and a brief prednisone burst this month  Her forehead pain went away then it came back on 02-04-23-  Coughing up green mucous again- pt not sure if coming from a PND   Gastrointestinal:         Has GERD- taking nexium   Allergic/Immunologic:        Using flonase          Objective:      acetaminophen SR (TYLENOL) 650 mg tablet Take two tablets by mouth twice daily.    amitriptyline (ELAVIL) 25 mg tablet TAKE 1 TABLET BY MOUTH DAILY at bedtime    amLODIPine (NORVASC) 10 mg tablet TAKE 1 TABLET BY MOUTH DAILY    atorvastatin (LIPITOR) 80 mg tablet take 1/2 tablet BY MOUTH EVERY EVENING    CALCIUM CARBONATE/VITAMIN D3 (CALCIUM + D PO) Take 1 tablet by mouth daily.    carvediloL (COREG) 3.125 mg tablet TAKE 1 TABLET BY MOUTH TWICE DAILY. take with food.    celecoxib (CELEBREX) 200 mg capsule take one capsule by mouth once daily    clobetasoL (TEMOVATE) 0.05 % topical cream Apply  topically to affected area twice daily.    dicyclomine (BENTYL) 10 mg capsule take one capsule by mouth twice daily    duloxetine DR (CYMBALTA) 60 mg capsule TAKE 1 CAPSULE BY MOUTH DAILY    esomeprazole DR (NEXIUM) 40 mg capsule TAKE 1 TABLET BY MOUTH TWICE DAILY. Take ON an EMPTY stomach AT least 1 hour BEFORE OR 2 hours AFTER meals.    estradioL (ESTRACE) 0.5 mg tablet take 1 AND 1/2 tablets BY MOUTH DAILY    ezetimibe (ZETIA) 10 mg tablet TAKE 1 TABLET BY MOUTH DAILY    fluconazole (DIFLUCAN) 150 mg tablet Take 1 dose for yeast infection repeat in 2-3 days if needed    fluticasone propionate (FLONASE) 50 mcg/actuation nasal spray, suspension USE 2 SPRAYS in each nostril AS DIRECTED DAILY. shake BEFORE USE    icosapent ethyL (VASCEPA) 1 gram capsule take 2 capsules BY MOUTH TWICE DAILY WITH FOOD    levothyroxine (LEVOXYL) 137 mcg tablet Take one tablet by mouth daily 30 minutes before breakfast.    lisinopriL (ZESTRIL) 40 mg tablet TAKE 1 TABLET BY MOUTH DAILY    methocarbamoL (ROBAXIN) 500 mg tablet TAKE ONE TABLET TO TWO TABLETS BY MOUTH FOUR TIMES DAILY AS NEEDED FOR SPASMS.  mirabegron (MYRBETRIQ) 25 mg ER tablet Take one tablet by mouth daily.    MULTIVITAMIN PO Take 1 tablet by mouth daily.     Vitals:    02/12/23 0829   BP: 118/78   BP Source: Arm, Left Upper   Pulse: 70   Temp: 36.7 ?C (98.1 ?F)   Resp: 16   SpO2: 96%   TempSrc: Temporal   PainSc: Five   Weight: 75.9 kg (167 lb 6.4 oz)   Height: 167.2 cm (5' 5.83)     Body mass index is 27.16 kg/m?Marland Kitchen     Physical Exam  Vitals and nursing note reviewed.   Constitutional:       General: She is not in acute distress.     Appearance: Normal appearance. She is well-developed. She is not ill-appearing.   HENT:      Head: Normocephalic and atraumatic.      Right Ear: Tympanic membrane and ear canal normal.      Left Ear: Tympanic membrane and ear canal normal.      Mouth/Throat:      Mouth: Mucous membranes are moist.      Pharynx: Oropharynx is clear.   Eyes:      Conjunctiva/sclera: Conjunctivae normal.      Pupils: Pupils are equal, round, and reactive to light.   Neck:      Thyroid: No thyromegaly.   Cardiovascular:      Rate and Rhythm: Normal rate and regular rhythm.      Heart sounds: No murmur heard.  Pulmonary:      Effort: Pulmonary effort is normal.      Breath sounds: Normal breath sounds.   Abdominal:      General: Bowel sounds are normal. There is no distension.      Palpations: Abdomen is soft. There is no mass.      Tenderness: There is no abdominal tenderness.   Musculoskeletal:      Cervical back: Neck supple.      Right lower leg: No edema.      Left lower leg: No edema.   Lymphadenopathy:      Cervical: No cervical adenopathy.   Skin:     General: Skin is warm and dry.   Neurological:      General: No focal deficit present.      Mental Status: She is alert and oriented to person, place, and time.   Psychiatric:         Mood and Affect: Mood normal.         Behavior: Behavior normal.         Thought Content: Thought content normal.         Judgment: Judgment normal.           A/p  Freq sinu infections- ordered a CT sinuses and sent a rf to ENT  Menopause- will try to taper off estradiol over the next several months and see how she does- take 1 tab per day for a month- then 1/2 tab per day for a month -then 1/2 tab every other day for a month- then d/c  Hypothyroidism- her tsh was in the target range in October  Hyperlipidemia- very good lipid panel this summer- on lipitor- zetia- vascepa  Htn- good control  Rtc in July for a physical

## 2023-02-13 DIAGNOSIS — J329 Chronic sinusitis, unspecified: Secondary | ICD-10-CM

## 2023-03-14 ENCOUNTER — Encounter: Admit: 2023-03-14 | Discharge: 2023-03-14 | Payer: MEDICARE

## 2023-03-15 ENCOUNTER — Encounter: Admit: 2023-03-15 | Discharge: 2023-03-15 | Payer: MEDICARE

## 2023-03-21 ENCOUNTER — Encounter: Admit: 2023-03-21 | Discharge: 2023-03-21 | Payer: MEDICARE

## 2023-03-26 ENCOUNTER — Encounter: Admit: 2023-03-26 | Discharge: 2023-03-26 | Payer: MEDICARE

## 2023-03-26 ENCOUNTER — Ambulatory Visit: Admit: 2023-03-26 | Discharge: 2023-03-27 | Payer: MEDICARE

## 2023-03-29 ENCOUNTER — Encounter: Admit: 2023-03-29 | Discharge: 2023-03-29 | Payer: MEDICARE

## 2023-03-29 MED ORDER — BENZONATATE 200 MG PO CAP
200 mg | ORAL_CAPSULE | ORAL | 0 refills | 9.00000 days | Status: AC
Start: 2023-03-29 — End: ?

## 2023-03-29 MED ORDER — AMOXICILLIN-POT CLAVULANATE 875-125 MG PO TAB
1 | ORAL_TABLET | Freq: Two times a day (BID) | ORAL | 0 refills | 7.00000 days | Status: AC
Start: 2023-03-29 — End: ?

## 2023-03-29 NOTE — Telephone Encounter
Pt states she has cough and congestion

## 2023-03-29 NOTE — Telephone Encounter
Ordered augmentin and tessalon perles

## 2023-03-29 NOTE — Telephone Encounter
Person Calling (Patient, Partner, Friend, etc.): Patient  Permission to Communicate on File Yes  Tonya Williamson / Aug 09, 1945    Chief Complaint   Patient presents with    Cough        Onset of Symptoms: 03/22/23    Summary: Pt left message stating having a cough and a runny nose since 03/22/23. Pt states is coughing up green sputum and blowing out green mucus. Pt states cough is worse when talking and has an intermitted wheezing with the cough. Pt denies having difficulty breathing or fever. Pt states has been using [facility] and would like a prescription. Care advice provided and pt is to be seen today per protocol, but states does not have a ride currently and lives an hour away. Advised pt on home care remedies and recommended UC with worsening sx. Routing to provider as pt is asking for [employer] or other cough prescription.     Disposition: Immediate office evaluation.    Following Disposition: No due to lack of a ride.    Reason for Disposition   Wheezing is present    Answer Assessment - Initial Assessment Questions  1. ONSET: When did the cough begin?       03/22/23.    2. SEVERITY: How bad is the cough today?       Super bad when speaking.    3. SPUTUM: Describe the color of your sputum (e.g., none, dry cough; clear, white, yellow, green)      Green.    4. HEMOPTYSIS: Are you coughing up any blood? If Yes, ask: How much? (e.g., flecks, streaks, tablespoons, etc.)      No.    5. DIFFICULTY BREATHING: Are you having difficulty breathing? If Yes, ask: How bad is it? (e.g., mild, moderate, severe)       No.    6. FEVER: Do you have a fever? If Yes, ask: What is your temperature, how was it measured, and when did it start?      No.    7. CARDIAC HISTORY: Do you have any history of heart disease? (e.g., heart attack, congestive heart failure)       No.    8. LUNG HISTORY: Do you have any history of lung disease?  (e.g., pulmonary embolus, asthma, emphysema)      No.    9. PE RISK FACTORS: Do you have a history of blood clots? (or: recent major surgery, recent prolonged travel, bedridden)      No.    10. OTHER SYMPTOMS: Do you have any other symptoms? (e.g., runny nose, wheezing, chest pain)        Runny nose-green mucus.    11. PREGNANCY: Is there any chance you are pregnant? When was your last menstrual period?        N/A.    12. TRAVEL: Have you traveled out of the country in the last month? (e.g., travel history, exposures)        No.    Protocols used: Cough-A-OH

## 2023-03-29 NOTE — Telephone Encounter
LM on pt's cell- signed permission on file regarding Rx for Augmentin and Tessalon Perles sent to [facility]  Basilio Cairo, RN

## 2023-04-02 ENCOUNTER — Encounter: Admit: 2023-04-02 | Discharge: 2023-04-02 | Payer: MEDICARE

## 2023-04-02 MED ORDER — FLUTICASONE PROPIONATE 50 MCG/ACTUATION NA SPSN
2 | Freq: Every day | NASAL | 3 refills | 60.00000 days | Status: AC
Start: 2023-04-02 — End: ?

## 2023-04-02 MED ORDER — DULOXETINE 60 MG PO CPDR
60 mg | ORAL_CAPSULE | Freq: Every day | ORAL | 2 refills | 60.00000 days | Status: AC
Start: 2023-04-02 — End: ?

## 2023-04-02 NOTE — Telephone Encounter
 Medication Name: Fluticasone  Last office  visit: 02/12/23  Next office visit: 09/03/23  Last labs drawn: 08/31/22    Routing to Dr. Reita Cliche for approval/denial.

## 2023-04-02 NOTE — Telephone Encounter
 Medication Name: Duloxetine  Last office  visit: 02/12/23  Next office visit: 09/03/23  Last labs drawn: 08/31/22    Routing to Dr. Reita Cliche for approval/denial.

## 2023-04-19 ENCOUNTER — Encounter: Admit: 2023-04-19 | Discharge: 2023-04-19 | Payer: MEDICARE

## 2023-04-19 MED ORDER — ESTRADIOL 0.5 MG PO TAB
ORAL_TABLET | 1 refills | 30.00000 days | Status: AC
Start: 2023-04-19 — End: ?

## 2023-04-19 NOTE — Telephone Encounter
 Does she have any estrace 0.5 mg at home left? Could try 1/2 tab every other day and see if that is enough to keep the hot flashes away - very minimal dose

## 2023-04-19 NOTE — Telephone Encounter
 Tonya Williamson 07/26/1945 Tel: [phone] Seen Doctor and was taken off estradiol. She had been doing really good but three weeks ago she got a cold and was given antibiotics. She has been having hot flashes since then. She was told by doctor to call and let us know if hot flashes come back and maybe we could prescribe something different to help.

## 2023-04-19 NOTE — Telephone Encounter
 Spoke with pt (2 ID verified and repeated) regarding   Dr. Knox Saliva below recs. Pt verbalized understanding. Doesn't think she has many pills left. Would like an Rx sent to [facility]  Basilio Cairo, RN

## 2023-05-03 ENCOUNTER — Encounter: Admit: 2023-05-03 | Discharge: 2023-05-03 | Payer: MEDICARE

## 2023-05-03 MED ORDER — CELECOXIB 200 MG PO CAP
200 mg | ORAL_CAPSULE | Freq: Every day | ORAL | 1 refills | Status: AC
Start: 2023-05-03 — End: ?

## 2023-05-03 MED ORDER — FAMOTIDINE 40 MG PO TAB
40 mg | ORAL_TABLET | Freq: Every day | ORAL | 1 refills | 90.00000 days | Status: AC
Start: 2023-05-03 — End: ?

## 2023-05-03 NOTE — Telephone Encounter
 Medication Name:  Famotidine, Celecoxib  Last office  visit: 02/12/24  Next office visit: 09/03/23  Last labs drawn: 08/31/22    Routing to Dr. Reita Cliche for approval/denial.

## 2023-06-11 ENCOUNTER — Encounter: Admit: 2023-06-11 | Discharge: 2023-06-11 | Payer: MEDICARE

## 2023-06-12 ENCOUNTER — Encounter: Admit: 2023-06-12 | Discharge: 2023-06-12 | Payer: MEDICARE

## 2023-06-12 ENCOUNTER — Ambulatory Visit: Admit: 2023-06-12 | Discharge: 2023-06-12 | Payer: MEDICARE

## 2023-06-13 ENCOUNTER — Encounter: Admit: 2023-06-13 | Discharge: 2023-06-13 | Payer: MEDICARE

## 2023-06-26 ENCOUNTER — Encounter: Admit: 2023-06-26 | Discharge: 2023-06-26 | Payer: MEDICARE

## 2023-06-26 MED ORDER — LEVOTHYROXINE 137 MCG PO TAB
137 ug | ORAL_TABLET | Freq: Every day | ORAL | 3 refills | 30.00000 days | Status: AC
Start: 2023-06-26 — End: ?

## 2023-06-26 MED ORDER — EZETIMIBE 10 MG PO TAB
10 mg | ORAL_TABLET | Freq: Every day | ORAL | 3 refills | 90.00000 days | Status: AC
Start: 2023-06-26 — End: ?

## 2023-06-26 MED ORDER — CARVEDILOL 6.25 MG PO TAB
6.25 mg | ORAL_TABLET | Freq: Two times a day (BID) | ORAL | 3 refills | 90.00000 days | Status: AC
Start: 2023-06-26 — End: ?

## 2023-06-26 MED ORDER — AMITRIPTYLINE 25 MG PO TAB
25 mg | ORAL_TABLET | Freq: Every evening | ORAL | 3 refills | 30.00000 days | Status: AC
Start: 2023-06-26 — End: ?

## 2023-06-26 MED ORDER — LISINOPRIL 40 MG PO TAB
40 mg | ORAL_TABLET | Freq: Every day | ORAL | 3 refills | 90.00000 days | Status: AC
Start: 2023-06-26 — End: ?

## 2023-07-02 ENCOUNTER — Encounter: Admit: 2023-07-02 | Discharge: 2023-07-02 | Payer: MEDICARE

## 2023-08-06 ENCOUNTER — Encounter: Admit: 2023-08-06 | Discharge: 2023-08-06 | Payer: MEDICARE

## 2023-08-06 MED ORDER — METHOCARBAMOL 500 MG PO TAB
500-1000 mg | ORAL_TABLET | Freq: Four times a day (QID) | ORAL | 5 refills | 10.00000 days | Status: AC | PRN
Start: 2023-08-06 — End: ?

## 2023-08-06 NOTE — Telephone Encounter
 Medication Name: Robaxin    Last office  visit: 06/12/23  Next office visit: 09/03/23  Last labs drawn: 06/12/23    Routing to Dr. Gene for approval/denial.

## 2023-09-03 ENCOUNTER — Encounter: Admit: 2023-09-03 | Discharge: 2023-09-03 | Payer: MEDICARE

## 2023-09-03 ENCOUNTER — Ambulatory Visit: Admit: 2023-09-03 | Discharge: 2023-09-04 | Payer: MEDICARE

## 2023-09-04 ENCOUNTER — Encounter: Admit: 2023-09-04 | Discharge: 2023-09-04 | Payer: MEDICARE

## 2023-09-24 ENCOUNTER — Encounter: Admit: 2023-09-24 | Discharge: 2023-09-24 | Payer: MEDICARE

## 2023-09-24 MED ORDER — ICOSAPENT ETHYL 1 GRAM PO CAP
2 g | ORAL_CAPSULE | Freq: Two times a day (BID) | ORAL | 3 refills | 30.00000 days | Status: AC
Start: 2023-09-24 — End: ?

## 2023-10-03 ENCOUNTER — Encounter: Admit: 2023-10-03 | Discharge: 2023-10-03 | Payer: MEDICARE

## 2023-10-03 NOTE — Progress Notes
 Prior auth for Esomeprazole  approved.   Mercy Shed LPN

## 2023-10-22 ENCOUNTER — Encounter: Admit: 2023-10-22 | Discharge: 2023-10-22 | Payer: MEDICARE

## 2023-10-22 MED ORDER — CLOBETASOL 0.05 % TP CREA
Freq: Two times a day (BID) | TOPICAL | 3 refills | 30.00000 days | Status: AC | PRN
Start: 2023-10-22 — End: ?

## 2023-10-22 NOTE — Telephone Encounter
 Medication Name: Clobetasol   Last office  visit: 09/03/23  Next office visit: n/a  Last labs drawn: 06/12/23    Routing to Dr. Gene for approval/denial.

## 2023-10-31 ENCOUNTER — Encounter: Admit: 2023-10-31 | Discharge: 2023-10-31 | Payer: MEDICARE

## 2023-10-31 ENCOUNTER — Ambulatory Visit: Admit: 2023-10-31 | Discharge: 2023-11-01 | Payer: MEDICARE

## 2023-11-23 ENCOUNTER — Encounter: Admit: 2023-11-23 | Discharge: 2023-11-23 | Payer: MEDICARE

## 2023-11-23 MED ORDER — CELECOXIB 200 MG PO CAP
200 mg | ORAL_CAPSULE | Freq: Every day | ORAL | 2 refills | 30.00000 days | Status: AC
Start: 2023-11-23 — End: ?

## 2024-01-15 NOTE — Progress Notes [1]
 Neuropsychological Evaluation    01/17/2024    Tonya Williamson  [id number]  05/27/1945 (78 y.o.)     Referral: Tonya Williamson HERO, MD    Reason for Referral: Neuropsychological assessment    Chief Complaint: Memory Loss    History of Present Illness: The following is derived from a review of the pertinent, available medical records and a diagnostic clinical interview with the patient and husband.    Ms. Tonya Williamson is a 78 y.o. female who has a past medical history of Arthritis, Depression (2001), Dizziness, GERD (gastroesophageal reflux disease) (2000), HTN, Hyperlipidemia, Hyperthyroidism, Hypertriglyceridemia (2005), Hypothyroid, Migraine, Seasonal allergic reaction, SSS (sick sinus syndrome) (CMS-HCC) (2006), Ulcer of the stomach and intestine, and Urinary tract infection.    Record Review: She presented to Dr. Gene on 06/12/2023 with a six-month history of frequent, moderate-intensity, late afternoon bitemporal headaches relieved by sleep, managed with acetaminophen, and consideration of magnesium  oxide and riboflavin prophylaxis. She reported no recent memory deficits, but her husband noted memory decline over the past year, prompting neuropsychological testing and MRI to evaluate cognitive function and rule out structural causes. Neurological and psychiatric exams were unremarkable, with normal mood, behavior, alertness, and orientation, and no focal deficits observed. Follow-up was planned to reassess cognitive status and treatment response after diagnostic workup completion.    Her brain MRI from 07/02/2023 showed no acute infarct or hemorrhage. There were scattered nonspecific white matter signal changes, most likely related to chronic small vessel disease, with chronic microhemorrhages noted anterior to the bilateral external capsules. Mild age-appropriate cerebral volume loss was present. No suspicious enhancing or calvarial lesions were identified. Findings may also be consistent with chronic migraine, given her history. Orbits, paranasal sinuses, mastoid air cells, and intracranial arteries appeared unremarkable.    Current Symptoms: Over the past year, she has experienced memory issues, including repetitive questioning and difficulty recalling names or details of conversations. These symptoms occur frequently but have not significantly worsened. She reports that her short-term memory is affected, sometimes forgetting information discussed earlier in the day or the previous day, while her long-term memory remains intact. She also struggles with word-finding during conversations. Her daily activities, such as cooking, Bible study, and managing a checking account, remain unaffected. She drives to familiar places without issues and maintains good focus and concentration on tasks like following recipes and participating in Bible study. She reports good sleep quality and energy levels, with no history of sleep apnea. Emotional stress due to family issues, particularly with her daughter, has been managed with therapy, and she does not take medication for depression or anxiety. She has a history of headaches, which have improved recently, and experiences visual disturbances similar to migraine auras. She has had issues with vertigo, managed with repositioning maneuvers. Her current medications include amitriptyline  for stomach issues and sleep, dicyclomine  for GI tract cramping, and methocarbamol  as needed for neck spasms. Her family history includes her mother having strokes related to low potassium levels. She previously took estrogen but discontinued it due to side effect concerns. She engages in regular physical activity, attending swimming classes two to three times a week.    Relevant History:  Past Medical History:    Arthritis    Depression    Dizziness    GERD (gastroesophageal reflux disease)    HTN    Hyperlipidemia    Hyperthyroidism    Hypertriglyceridemia    Hypothyroid    Migraine    Seasonal allergic reaction    SSS (sick sinus  syndrome) (CMS-HCC)    Ulcer of the stomach and intestine    Urinary tract infection     Surgical History:   Procedure Laterality Date    HX TAH AND BSO  1991    endometriosis    HX CHOLECYSTECTOMY  2002    SINUS SURGERY  2003    HX KNEE ARTHROSCOPY  2007    rt- scope    CERVICAL SPINE SURGERY  04/15/2013    ENDOSCOPY  03/30/2015    gastric polyps    HYDROGEN BREATH TEST N/A 05/21/2015    Performed by Priscella Squires, MD at [facility] ENDO    CARDIOVASCULAR SCAN  12/14/2017    scored a 217.5- mostly in her LAD and RCA    UPPER GASTROINTESTINAL ENDOSCOPY  12/06/2018    small hiatal hernia    ESOPHAGOGASTRODUODENOSCOPY WITH SPECIMEN COLLECTION BY BRUSHING/ WASHING N/A 12/06/2018    Performed by Malissa Norleen LABOR, MD at [facility] OR    CARDIOVASCULAR STRESS TEST  11/03/2020    neg- EF 75%    NUC MED HISTORICAL REPORT  04/12/2021    osteopenia    COLONOSCOPY REPORT  04/27/2021    hemorrhoids- polyp- 7 yr plan    COLONOSCOPY DIAGNOSTIC WITH SPECIMEN COLLECTION BY BRUSHING/ WASHING - FLEXIBLE N/A 04/27/2021    Performed by Ledora Catalina, MD at [facility] OR    KNEE REPLACEMENT Left 09/2021    MAMMO HISTORICAL REPORT  08/31/2022    neg    ECHOCARDIOGRAM PROCEDURE  10/14/20    HX CESAREAN SECTION      HX JOINT REPLACEMENT Bilateral     both shoulders, and right knee    HX KNEE SURGERY  05-07-12- 08-21-14    rt- TKR- redo 08-21-14- Dr. 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     Employer: [employer]    Occupation: retired- 12-20     Employer: [employer]    Occupation: 1 son in [place] -52 and 1 daughter- 55- in [place]-4 grand children- 8 greats   Tobacco Use    Smoking status: Never    Smokeless tobacco: Never   Vaping Use    Vaping status: Never Used   Substance and Sexual Activity    Alcohol use: Not Currently     Alcohol/week: 1.0 standard drink of alcohol     Types: 1 Glasses of wine per week     Comment: every other week    Drug use: Never    Sexual activity: Not Currently     Partners: Male     Birth control/protection: None   Other Topics Concern    Caffeine Concern Yes     Comment: 1-2 cups coffee daily. No Soda or Tea  Prior Studies:   Cognitive Testing:  None    Neuroimaging:  07/02/2023  MRI HEAD  HISTORY: Memory loss. Headache.   TECHNIQUE: Multiplanar, multisequence magnetic resonance imaging of the   brain was performed prior to and following the administration of   intravenous contrast.   COMPARISON: None.   FINDINGS: There is no acute infarct. There is no acute hemorrhage. There   are foci suspected chronic microhemorrhage anterior to the bilateral   external capsules. There is mild age-appropriate cerebral volume loss.   There are multiple not suspected or focal T2/FLAIR hyperintense lesions   within the cerebral white matter.   The orbits are unremarkable. The paranasal sinuses and mastoid air cells   are unremarkable. There are normal flow voids within the intracranial   arteries. There is no suspicious calvarial lesion. There is no suspicious   enhancing lesion.   IMPRESSION   IMPRESSION:   1. No acute intracranial finding.   2. Scattered nonspecific foci of signal change within the cerebral white   matter. This is most commonly due to chronic small vessel disease in a   patient of this age. Given the patient history, the possibility of   superimposed lesions due to chronic migraine headaches is not excluded.     Labs:  Lab Results   Component Value Date/Time    NA 140 06/12/2023 02:11 PM    K 4.3 06/12/2023 02:11 PM    CL 104 06/12/2023 02:11 PM    CO2 28 06/12/2023 02:11 PM    GAP 8 06/12/2023 02:11 PM    BUN 21 06/12/2023 02:11 PM    CR 0.71 06/12/2023 02:11 PM    GLU 83 06/12/2023 02:11 PM    CA 9.7 06/12/2023 02:11 PM    ALBUMIN 4.5 06/12/2023 02:11 PM    TOTPROT 7.1 06/12/2023 02:11 PM    ALKPHOS 72 06/12/2023 02:11 PM    AST 17 06/12/2023 02:11 PM    ALT 20 06/12/2023 02:11 PM    TOTBILI 0.3 06/12/2023 02:11 PM    GFR >60 06/12/2023 02:11 PM    GFRAA >60 11/11/2019 01:46 PM     Lab Results   Component Value Date/Time    TSH 3.01 06/12/2023 02:11 PM    VITB12 638 06/12/2023 02:11 PM     No results found for: PTAUABE, ABETA42, TOTALTAU, PHOSPHOTAU, PTAU181, NFLCC1, NFLCS1   BMI Readings from Last 1 Encounters:   10/31/23 27.12 kg/m?     Medications:   acetaminophen SR (TYLENOL) 650 mg tablet Take two tablets by mouth twice daily.    amitriptyline  (ELAVIL ) 25 mg tablet TAKE 1 TABLET BY MOUTH DAILY at bedtime    amLODIPine  (NORVASC ) 10 mg tablet Take one tablet by mouth daily.    atorvastatin  (LIPITOR) 80 mg tablet Take one tablet by mouth daily.    CALCIUM CARBONATE/VITAMIN D3 (CALCIUM + D PO) Take 1 tablet by mouth daily.    carvediloL  (COREG ) 6.25 mg tablet Take one tablet by mouth twice daily with meals.    celecoxib  (CELEBREX ) 200 mg capsule Take one capsule by mouth daily.    clobetasoL  (TEMOVATE ) 0.05 % topical cream Apply  topically to affected area twice daily as needed.    dicyclomine  (BENTYL ) 10 mg capsule Take one capsule by mouth twice daily.    duloxetine  DR (CYMBALTA ) 60 mg capsule Take one capsule by mouth daily.    esomeprazole  DR (NEXIUM ) 40 mg capsule Take one capsule by mouth twice daily. Take on an empty stomach  at least 1 hour before or 2 hours after food.    ezetimibe  (ZETIA ) 10 mg tablet take one tablet by mouth once daily    famotidine  (PEPCID ) 40 mg tablet Take one tablet by mouth daily.    fluticasone  propionate (FLONASE ) 50 mcg/actuation nasal spray, suspension Apply two sprays to each nostril as directed daily.    icosapent  ethyL (VASCEPA ) 1 gram capsule take 2 capsules BY MOUTH TWICE DAILY WITH FOOD    levothyroxine  (SYNTHROID ) 137 mcg tablet TAKE 1 TABLET BY MOUTH daily 30 minutes BEFORE breakfast    lisinopriL  (ZESTRIL ) 40 mg tablet take one tablet by mouth once daily    methocarbamoL  (ROBAXIN ) 500 mg tablet TAKE ONE TABLET TO TWO TABLETS BY MOUTH FOUR TIMES DAILY AS NEEDED FOR SPASMS.    mirabegron  (MYRBETRIQ ) 25 mg ER tablet Take one tablet by mouth daily.    MULTIVITAMIN PO Take 1 tablet by mouth daily.   Anticholinergic Cognitive Burden Scale: 9 (high)    Neurobehavioral Status Examination: The patient is pleasant and cooperative. Vision and hearing are deemed appropriate for testing purposes. She is attentive and engaged in the testing process. Speech is fluent with no paraphasic errors noted. Thought process is linear and goal-directed. Insight is good. Affect is appropriate and mood-congruent. Performance validity testing is normal. Overall, results are valid and interpretable.    Neuropsychological Tests Administered: See table at the end of this note for a listing of the neuropsychological tests administered with standardized normative scores (if available).     Results: Screening: Her score on the STMS of 30/38 was mildly low.  - Visuomotor scanning, visuomotor speed, and rapid decision making were average.  - Auditory attention span was below average; complex attention/auditory working memory ranged from low average to below average; visual working memory was average.  - Verbal memory: learning efficiency, delayed recall, and recognition cues for word and story recall were average; retention was exceptionally high; immediate and delayed recall of short stories were low average.  - Language: naming was exceptionally high; rapid word generation to semantic cues ranged from average to low average.  - Visuospatial processing ranged from low average to above average; visual construction was average.  - Executive functioning: mental flexibility and rapid word generation to phonemic cues were average; problem solving/response to feedback and number of errors were low average; verbal abstract reasoning was below average.  - Self-reported depression and anxiety was not elevated.    Impression: Findings are notable for mild inefficiencies in attention and executive functioning.  All other cognitive abilities, including memory, are within expectations considering her age, education level, and other demographic variables.  She did not endorse elevated depression or anxiety on self-report.  She is functionally independent.  This neurocognitive profile is nonspecific but suggests mild frontal subcortical involvement.  Etiology is likely multifactorial and related to fluctuations in mood and stress, her experience of pain (headaches have improved), medication side effects (anticholinergics), and possible underlying contribution from the white matter changes found on her recent MRI.  This neurocognitive profile would not be consistent with a typical amnestic Alzheimer's presentation.  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    Recommendations:  Feedback and Follow-up.  Feedback of the results of today's evaluation was discussed.  Return visit with the referring physician is pending.  This report will be forwarded to the referring physician.  This report is viewable in her MyChart patient portal ([email or web address]).  Daily Activities.  A trusted individual should monitor her  activities of a medical (including medications), financial, and legal nature to reduce the likelihood of mistakes.  Compensatory Strategies.  She may benefit from the utilization of any of the following cognitive compensatory strategies if not already doing so:  External Aids -   Calendars and Planners: Use physical or digital calendars to keep track of appointments, tasks, and important dates.  Reminders and Alarms: Set alarms or reminders on smartphones or other devices to prompt memory for specific events or activities.  Notes and Lists: Jot down important information, to-do lists, and reminders.  Memory Apps: Explore memory apps designed to assist with reminders, medication schedules, and daily routines.  Environmental Modifications -   Minimize Distractions: Create a quiet and organized environment to reduce distractions during tasks that require attention and memory.  Structured Routines: Establish consistent daily routines to enhance predictability and reduce cognitive load.  Labeling and Color Coding: Use labels, color-coded systems, and visual cues to make information more accessible.  Cognitive Strategies -  Chunking: Break down complex information into smaller chunks or groups to facilitate memory retention.  Visualization: Create mental images or associations to remember details.  Acronyms and Mnemonics: Use acronyms or mnemonic devices to remember lists or sequences.  Repetition and Review: Regularly review important information to reinforce memory.  Brain Health.  She is encouraged to engage in cognitive and physical health-promoting activities, including:  Physician-monitored Exercise -  Aerobic Exercise: Activities like walking, running, swimming, or cycling are excellent for boosting brain function.  Strength Training: Incorporating muscle-strengthening activities can help with memory and executive functions.  Balance Training: Exercises that challenge your balance can have a positive effect on brain health.  Yoga or Tai Chi: Gentle forms of exercise can improve cognitive function and is also beneficial for stress reduction.  Dance: Learning new dance moves can increase your brain?s processing speed and memory.  Dietary Choices -   The Mediterranean Diet is renowned for its brain health benefits. Studies suggest it may improve cognitive capabilities, reduce the risk of cognitive decline, and enhance mood. It emphasizes:  Fruits, vegetables, whole grains, and nuts  Healthy fat choices, like olive oil over butter  Fish and poultry over red meat  The MIND Diet combines the Mediterranean and DASH diets, focusing on foods that support both brain and heart health. Key components include:  Leafy greens, berries, nuts, and whole grains  Fish, poultry, beans, and olive oil  Limited intake processed foods and sweets  Both diets advocate for a plant-rich intake, which is linked to reduced oxidative stress and inflammation, potentially lowering the risk of neurodegenerative diseases.  Sleep Hygiene -  Consistency: Go to bed and wake up at the same time every day, even on weekends.  Bedroom Environment: Ensure your bedroom is quiet, dark, relaxing, and at a comfortable temperature (usually on the cooler side).  Electronic Devices: [employer], computers, and smartphones from the bedroom to avoid blue light exposure before sleep.  Diet: Avoid large meals, caffeine, and alcohol before bedtime.  Exercise: Regular physical activity can help you fall asleep more easily at night, but avoid exercising in the evening.  Relaxing Routine: Establish a pre-sleep routine that may include reading, taking a bath, or meditation.  Natural Light: Get exposure to natural light during the day, especially in the morning.  Napping: Avoid napping after 2 p.m. and limit naps to 20-30 minutes.  Stress Reduction -   Breath Focus: Take long, slow, deep breaths to help disengage from distracting thoughts.  Body Scan:  Combine breath focus with progressive muscle relaxation, scanning your body for tension and releasing it.  Guided Imagery: Use visualization to imagine a peaceful scene or experience.  Mindfulness Meditation: Practice being present in the moment and aware of your surroundings and feelings.  Repetitive Prayer: Recite a prayer or mantra to focus the mind and promote a sense of peace.  Social Support: Engage with friends and family or join support groups to share experiences and feelings.  Journaling: Writing down thoughts and feelings can help you understand and manage them.  Positive No: Learn to say no to overextending yourself with tasks that compromise your quality of life.  Cognitive Stimulation -  Learning New Skills: Taking up a new hobby, learning a language, or playing a musical instrument can boost memory and cognitive function.  Playing Games: [employer], board games, and puzzles like crosswords or Sudoku can decrease the risk of cognitive impairment and engage various cognitive functions.  Socializing: Having discussions, participating in group activities, and social sports can stimulate the brain.  Visualization: Helps organize information and make decisions. Practice by visualizing detailed scenarios related to daily tasks.  MyAlliance Program.  She is encouraged to sign up for [facility]iance [facility] ([email or web address]), a free program offered by the [facility] for anyone interested in improving brain and cognitive health. This educational and community engagement program provides:  Useful and actionable information customized to one's interests and needs.  Tips on healthy eating, exercise and lifestyle.  Advice and support for caregivers.  Special invitations to webinars and other events.  Repeat Evaluation.  Repeat neurocognitive evaluation is recommended in 12 to 18 months in order to update her cognitive trajectory and treatment recommendations.    Thank you for the opportunity to participate in this patient's care. If there are questions about this note, or if I can be of any additional assistance, please feel free to contact me at [phone].    Juliene Mussel, PhD, ABPP  Board Certified in Clinical Neuropsychology  Department of Neurology      Time Documentation: Total time for this evaluation included 102 minutes of neuropsychological test administration and scoring by technician, two or more tests, any method; 45 minutes of neuropsychologist time in interview/neurobehavioral status examination, and 95 minutes of neuropsychological testing evaluation services by neuropsychologist time including integration patient data, interpretation of standardized test results and clinical data, clinical decision-making, treatment planning and report and interactive feedback to the patient, family member(s) or caregiver(s).    Test Data:  Cognitive Screening Raw Score   Short Test of Mental Status (STMS) 30/38      Orientation 8/8      Attention 4/7      Immediate Recall (# of trials: 2) 4/4      Calculations 1/4      Abstraction 3/3      Visuospatial/Construction 4/4      Information 4/4      Delayed Recall 2/4       Premorbid Functioning Raw Score T-score Percentile Score Label   HART-B 15 49 45 Average          Processing Speed       Trail Making Test (TMT): Part A 41 51 54 Average   [place] Perceptual Comparison Test: Letters 22 54 66 Average   [place] Perceptual Comparison Test: Patterns 23 44 27 Average          Attention/Working Memory       Digit Span: Longest Span Forward 4 32  4 Below Average   Digit Span: Longest Span Backward 3 37 10 Low Average   Brief Test of Attention: Total Score 8 35 7 Below Average   WAIS-5 Symbol Span 15 53 63 Average          Verbal Memory       HVLT-R: Total Recall (Trials 4, 8, 8) 20 45 31 Average   HVLT-R: Delayed Recall 9 56 73 Average   HVLT-R: Retention (%) 112.5 77 > 99 Exceptionally High   HVLT-R: Recognition Disc. (Hits-False Positives) 10 49 46 Average   WMS-IV: Logical Memory I 26 43 24 Low Average   WMS-IV: Logical Memory II 10 39 14 Low Average   WMS-IV: Logical Memory Recognition 17/23 --- 26-50 Average          Language       CIFA Category Fluency: Animals 16 50 50 Average   CIFA Category Fluency: Supermarket Items 16 39 14 Low Average   CIFA Category Fluency: Total Score 32 41 18 Low Average   [place] Naming Test (BNT-30) 30 71 98 Exceptionally High          Visuospatial/Construction       Clock Drawing: Request Score 5 64 92 Above Average   [place] Facial Recognition Task: Total Correct 19 43 24 Low Average   Rey Complex Figure Test Copy Score 28 45 31 Average   Rey Complex Figure Test Time to Copy 294 40 16 Low Average          Executive Functions       Trail Making Test (TMT): Part B (4 error(s)) 173 44 27 Average   CIFA Letter Fluency: S Words 11 48 42 Average   CIFA Letter Fluency: P Words 11 48 42 Average   CIFA Letter Fluency: Total Score 22 49 46 Average   M-WCST: Number of Categories Correct 4 40 16 Low Average   M-WCST: Number of Perseverative Errors 12 31 3  Below Average   M-WCST: Number of Total Errors 21 39 14 Low Average   M-WCST: Percent of Perseverative Errors 57 37 10 Low Average   Cognitive Estimation Test (CET): Total Score 9 35 7 Below Average          Mood/Behavior       GDS-15 1 --- --- Not Elevated   GAS-10 4 --- --- Not Elevated   Calibration based on Age, Sex, Education, Race, and HART-B score    Performance Validity  RDS 1/1 valid indicator   WMS-IV LM ACS 1/1 valid indicator   WMS-IV LM Rec. % 1/1 valid indicator   HVLT-R Disc 1/1 valid indicator     Key:  DC Discontinued task   * Standard scores reversed for ease of interpretation.     T-score Percentile Score Label   >69 >97 Exceptionally High   64-69 91-97 Above Average   57-63 75-90 High Average   44-56 25-74 Average   37-43 9-24 Low Average   30-36 2-8 Below Average   <30 <2 Exceptionally Low   Score labels based on the American Academy of Clinical Neuropsychology (AACN) consensus conference statement on uniform labeling of performance test scores (Guilmette et al., 2020).

## 2024-01-17 ENCOUNTER — Encounter: Admit: 2024-01-17 | Discharge: 2024-01-17 | Payer: MEDICARE

## 2024-01-17 ENCOUNTER — Ambulatory Visit: Admit: 2024-01-17 | Discharge: 2024-01-18 | Payer: MEDICARE

## 2024-01-17 DIAGNOSIS — R4189 Other symptoms and signs involving cognitive functions and awareness: Principal | ICD-10-CM

## 2024-01-17 NOTE — Progress Notes [1]
 Total psychometrist face-to-face time with patient and scoring time was 102 minutes.

## 2024-03-03 ENCOUNTER — Ambulatory Visit: Admit: 2024-03-03 | Discharge: 2024-03-03 | Payer: MEDICARE

## 2024-03-03 DIAGNOSIS — R0602 Shortness of breath: Secondary | ICD-10-CM

## 2024-03-03 DIAGNOSIS — J019 Acute sinusitis, unspecified: Principal | ICD-10-CM

## 2024-03-03 MED ORDER — BENZONATATE 200 MG PO CAP
200 mg | ORAL_CAPSULE | ORAL | 0 refills | 9.00000 days | Status: AC
Start: 2024-03-03 — End: ?

## 2024-03-03 MED ORDER — AMOXICILLIN-POT CLAVULANATE 875-125 MG PO TAB
1 | ORAL_TABLET | Freq: Two times a day (BID) | ORAL | 0 refills | 7.00000 days | Status: AC
Start: 2024-03-03 — End: ?

## 2024-03-03 MED ORDER — BREATHERITE MDI SPACER MISC SPCR
0 refills | 30.00000 days | Status: AC
Start: 2024-03-03 — End: ?

## 2024-03-03 MED ORDER — PREDNISONE 20 MG PO TAB
20 mg | ORAL_TABLET | Freq: Every day | ORAL | 0 refills | 30.00000 days | Status: AC
Start: 2024-03-03 — End: ?

## 2024-03-03 MED ORDER — FLUCONAZOLE 150 MG PO TAB
ORAL_TABLET | ORAL | 0 refills | 3.00000 days | Status: AC
Start: 2024-03-03 — End: ?

## 2024-03-03 MED ORDER — ALBUTEROL SULFATE 90 MCG/ACTUATION IN HFAA
2 | RESPIRATORY_TRACT | 0 refills | 25.00000 days | Status: AC | PRN
Start: 2024-03-03 — End: ?

## 2024-03-03 NOTE — Patient Instructions [37]
 You were given a prescription for a short burst of oral steroids (prednisone ) to take daily with breakfast for 5 days, Augmentin  to take twice a day for 7 days, Tessalon  pearls to take as needed for your cough up to 3 times a day, Diflucan  to take if you develop yeast infection symptoms from the antibiotics, and an albuterol  inhaler with spacer to do 2 puffs every 4-6 hours as needed for coughing, wheezing, chest tightness, and/or shortness of breath.     You should take a daily probiotic and/or eat yogurt daily while on the antibiotics and for a week after to help keep the good bacteria in your stomach.     You should take the Tessalon  pearls with a small sip of water and wait 15-20 minutes to eat/drink to allow the medication to start working.  You should take Allegra and Flonase  daily for the next week and then as needed after that.  You can take Benadryl at bedtime as needed for your symptoms and to help you sleep if the steroids are keeping you awake at night.  You can take 800 mg of Ibuprofen every 6-8 hours as needed for fever and pain.  Please make sure to get plenty of fluids and rest along with running a humidifier in your room at night.  Follow up to an in person urgent care if your symptoms worsen, fail to improve, or you have any other urgent/emergent concerns.    Please check MyChart for your results which may take up to 72 hours.   We will send a MyChart results message but attempt to call you if you still need to sign up for MyChart.   Information to sign up to MyChart is on your After Visit Summary (AVS)  The MyChart help number is [phone], and the nurse triage number is [phone].

## 2024-03-03 NOTE — Progress Notes [1]
 Telehealth Visit Note    Date of Service: 03/03/2024    Subjective:       Tonya Williamson is a 79 y.o. female.    History of Present Illness:  Tonya Williamson is here today for evaluation and treatment of congestion, runny nose, and cough for the past week.  She states that she started coughing up green drainage yesterday.  She has knee and shoulder replacements so tries to get on antibiotics sooner rather than later.  She has taken coricidin that helps her feel a little better but doesn't make it go away.  Her husband states she coughed all night long and hasn't been able to get any sleep.  Her symptoms have continued to get worse.  She hasn't taken a COVID/Flu test at this time.  She denies any fever symptoms but she has a really bad headache.  She denies any GI symptoms with this.  Other than her cough getting worse at night time her symptoms are pretty much the same throughout the day.              Objective   Objective:          acetaminophen SR (TYLENOL) 650 mg tablet Take two tablets by mouth twice daily.    amitriptyline  (ELAVIL ) 25 mg tablet TAKE 1 TABLET BY MOUTH DAILY at bedtime    amLODIPine  (NORVASC ) 10 mg tablet Take one tablet by mouth daily.    atorvastatin  (LIPITOR) 80 mg tablet Take one tablet by mouth daily.    CALCIUM CARBONATE/VITAMIN D3 (CALCIUM + D PO) Take 1 tablet by mouth daily.    carvediloL  (COREG ) 6.25 mg tablet Take one tablet by mouth twice daily with meals.    celecoxib  (CELEBREX ) 200 mg capsule Take one capsule by mouth daily.    clobetasoL  (TEMOVATE ) 0.05 % topical cream Apply  topically to affected area twice daily as needed.    dicyclomine  (BENTYL ) 10 mg capsule Take one capsule by mouth twice daily.    duloxetine  DR (CYMBALTA ) 60 mg capsule Take one capsule by mouth daily.    esomeprazole  DR (NEXIUM ) 40 mg capsule Take one capsule by mouth twice daily. Take on an empty stomach at least 1 hour before or 2 hours after food.    ezetimibe  (ZETIA ) 10 mg tablet take one tablet by mouth once daily    famotidine  (PEPCID ) 40 mg tablet Take one tablet by mouth daily.    fluticasone  propionate (FLONASE ) 50 mcg/actuation nasal spray, suspension Apply two sprays to each nostril as directed daily.    icosapent  ethyL (VASCEPA ) 1 gram capsule take 2 capsules BY MOUTH TWICE DAILY WITH FOOD    levothyroxine  (SYNTHROID ) 137 mcg tablet TAKE 1 TABLET BY MOUTH daily 30 minutes BEFORE breakfast    lisinopriL  (ZESTRIL ) 40 mg tablet take one tablet by mouth once daily    methocarbamoL  (ROBAXIN ) 500 mg tablet TAKE ONE TABLET TO TWO TABLETS BY MOUTH FOUR TIMES DAILY AS NEEDED FOR SPASMS.    mirabegron  (MYRBETRIQ ) 25 mg ER tablet Take one tablet by mouth daily.    MULTIVITAMIN PO Take 1 tablet by mouth daily.       Computed Telehealth Body Mass Index unavailable. One or more values for this score either were not found within the given timeframe or did not fit some other criterion.  Physical Exam  Constitutional:       Appearance: Normal appearance.   HENT:      Head: Normocephalic.      Mouth/Throat:  Lips: Pink.   Eyes:      General: Lids are normal.   Pulmonary:      Effort: Pulmonary effort is normal.   Neurological:      General: No focal deficit present.      Mental Status: She is alert and oriented to person, place, and time.   Psychiatric:         Attention and Perception: Attention and perception normal.         Mood and Affect: Mood and affect normal.         Speech: Speech normal.         Behavior: Behavior normal. Behavior is cooperative.         Thought Content: Thought content normal.         Cognition and Memory: Cognition and memory normal.         Judgment: Judgment normal.             Assessment and Plan:  1. Acute sinusitis, recurrence not specified, unspecified location        2. Shortness of breath          Encounter Medications   Medications    amoxicillin -potassium clavulanate (AUGMENTIN ) 875/125 mg tablet     Sig: Take one tablet by mouth twice daily with meals for 7 days.     Dispense:  14 tablet Refill:  0     Collaborating / Supervising Provider:   RING, HOPE A N [[id number]]    predniSONE  (DELTASONE ) 20 mg tablet     Sig: Take one tablet by mouth daily with breakfast for 5 days.     Dispense:  5 tablet     Refill:  0     Collaborating / Supervising Provider:   RING, HOPE A N [[id number]]    benzonatate  (TESSALON ) 200 mg capsule     Sig: Take one capsule by mouth every 8 hours.     Dispense:  30 capsule     Refill:  0     Collaborating / Supervising Provider:   RING, HOPE A N [[id number]]    albuterol  sulfate (PROAIR  HFA) 90 mcg/actuation HFA aerosol inhaler     Sig: Inhale two puffs by mouth into the lungs every 4 hours as needed.     Dispense:  8.5 g     Refill:  0     Collaborating / Supervising Provider:   RING, HOPE A N [[id number]]    inhalational spacing device (BREATHERITE MDI SPACER)     Sig: Use with Albuterol  inhaler     Dispense:  1 each     Refill:  0     Please dispense whatever brand you have in stock.  Thanks.     Collaborating / Supervising Provider:   RING, HOPE A N [[id number]]    fluconazole  (DIFLUCAN ) 150 mg tablet     Sig: Take 1 pill by mouth and repeat dose in 72 hours if symptoms continue.     Dispense:  2 tablet     Refill:  0     Collaborating / Supervising Provider:   RING, HOPE A N [[id number]]     Patient Instructions   You were given a prescription for a short burst of oral steroids (prednisone ) to take daily with breakfast for 5 days, Augmentin  to take twice a day for 7 days, Tessalon  pearls to take as needed for your cough up to 3 times a day, Diflucan  to take if you develop yeast infection  symptoms from the antibiotics, and an albuterol  inhaler with spacer to do 2 puffs every 4-6 hours as needed for coughing, wheezing, chest tightness, and/or shortness of breath.     You should take a daily probiotic and/or eat yogurt daily while on the antibiotics and for a week after to help keep the good bacteria in your stomach.     You should take the Tessalon  pearls with a small sip of water and wait 15-20 minutes to eat/drink to allow the medication to start working.  You should take Allegra and Flonase  daily for the next week and then as needed after that.  You can take Benadryl at bedtime as needed for your symptoms and to help you sleep if the steroids are keeping you awake at night.  You can take 800 mg of Ibuprofen every 6-8 hours as needed for fever and pain.  Please make sure to get plenty of fluids and rest along with running a humidifier in your room at night.  Follow up to an in person urgent care if your symptoms worsen, fail to improve, or you have any other urgent/emergent concerns.    Please check MyChart for your results which may take up to 72 hours.   We will send a MyChart results message but attempt to call you if you still need to sign up for MyChart.   Information to sign up to MyChart is on your After Visit Summary (AVS)  The MyChart help number is [phone], and the nurse triage number is [phone].                                15 minutes spent on this patient's encounter with counseling and coordination of care taking >50% of the visit.

## 2024-03-15 ENCOUNTER — Encounter: Admit: 2024-03-15 | Discharge: 2024-03-15 | Payer: MEDICARE

## 2024-03-20 ENCOUNTER — Encounter: Admit: 2024-03-20 | Discharge: 2024-03-20 | Payer: MEDICARE
